# Patient Record
Sex: Female | Born: 2000
Health system: Southern US, Community
[De-identification: ages and names within clinical notes are randomized; demographics above are authoritative.]

## PROBLEM LIST (undated history)

## (undated) DIAGNOSIS — F419 Anxiety disorder, unspecified: Secondary | ICD-10-CM

## (undated) DIAGNOSIS — H539 Unspecified visual disturbance: Secondary | ICD-10-CM

## (undated) HISTORY — DX: Anxiety disorder, unspecified: F41.9

## (undated) HISTORY — DX: Unspecified visual disturbance: H53.9

---
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
## 2018-05-05 ENCOUNTER — Ambulatory Visit (INDEPENDENT_AMBULATORY_CARE_PROVIDER_SITE_OTHER): Payer: BLUE CROSS/BLUE SHIELD | Admitting: Neurology

## 2018-05-05 ENCOUNTER — Encounter: Payer: Self-pay | Admitting: Neurology

## 2018-05-05 VITALS — BP 120/72 | HR 75 | Ht 68.0 in | Wt 134.5 lb

## 2018-05-05 DIAGNOSIS — R410 Disorientation, unspecified: Secondary | ICD-10-CM | POA: Diagnosis not present

## 2018-05-05 NOTE — Progress Notes (Signed)
PATIENT: Patricia Booth DOB: [birth year]  Chief Complaint  Patient presents with  . Confusion Episodes    She is here with her mother, Randa EvensJoanne.  She is still doing well on lamotrigine xr 200mg  at bedtime.  She had one episodes on ,[place] in [place] on 04[place]18[place].  She feels this was related to sleep deprivation (had been up for 33 hours).     HISTORICAL  Patricia Booth 17 year old female, seen in refer by her primary care doctor Chales SalmonDees, Janet, for evaluation of visual disturbance, initial evaluation was on August 01 2017.  Patient recorded recurrent episode of sudden onset transient confusion, started in her eighth grade 3 years ago in 2015, after playing basketball, with a flashing light, she felt transient confused, difficulty focusing, lasting for few minutes,  She had multiple recurrent episode at her ninth grade in 2016, and often happened after she playing basketball, in the bright light, flashing colors, she would have transient blurry vision, difficulty focusing, her body going to fall down, lasting for few minutes, sometimes longer even few hours, but there was no clinical seizure activity noted.  She was seen by cardiologist in 2016 there was no significant abnormality found, because of her recurrent episode associated with event, she has to give up her basketball, avoiding scenario like this.  Most recent event was in September 2018, she was with her friend and family at a basketball event, the loud noise, bright light, the bright color triggered her similar sensation, she felt dizzy, as if she is going to passing out, she has to put her head between her knees, later she went down for dinner, even then, she feel off, spaced off, difficulty focusing, she can hear people talking to her, but seems to from distance, lower extremity weakness, her symptoms eventually went away after overnight sleep,  She denies morning jerk, full-term, developmentally normal, there was no history  of seizure, no family history of epilepsy.  Update November 04, 2017: She only had a couple spells of transient confusion in an overstimulating environment, lasting less than 5 minutes, no headache, lamotrigine ER 200 mg has been very helpful,  UPDATE May 05 2018: We personally reviewed MRI of the brain with without contrast in November 2018, small developmental venous anomaly in the left parietal lobe, no acute abnormality.  EEG was normal on August 21, 2017  She had one recurrent spells on April 18, 2017, this is after 33 hours sleep deprivation, [place], she had a sudden onset feeling dizzy, though mild, she put her head in between her legs, symptoms persistent, she did not have total loss of consciousness, was able to carry on a conversation with her mother, but felt generalized weakness, confusion, difficulty take steps, whole episode lasting 1 hour, no seizure-like spells, she was taken to the local emergency room, reported normal vital signs, she denies headaches, no nausea  She has been tolerating lamotrigine XR 200 mg every night well, no significant side effect noticed, it did help cut back the frequency and severity of the spells,  REVIEW OF SYSTEMS: Full 14 system review of systems performed and notable only for as above  ALLERGIES: No Known Allergies  HOME MEDICATIONS: Current Outpatient Medications  Medication Sig Dispense Refill  . ALPRAZolam (XANAX) 0.5 MG tablet Take 0.5 mg by mouth as needed for anxiety.     . LamoTRIgine 200 MG TB24 24 hour tablet Take 1 tablet (200 mg total) by mouth at bedtime. 90 tablet  4  . sertraline (ZOLOFT) 25 MG tablet Taking 1.5 tablets daily.     No current facility-administered medications for this visit.     PAST MEDICAL HISTORY: Past Medical History:  Diagnosis Date  . Anxiety   . Visual disturbance     PAST SURGICAL HISTORY: History reviewed. No pertinent surgical history.  FAMILY HISTORY: Family History  Problem  Relation Age of Onset  . Healthy Mother   . Healthy Father     SOCIAL HISTORY:  Social History   Socioeconomic History  . Marital status: Single    Spouse name: Not on file  . Number of children: 0  . Years of education: HS student  . Highest education level: Not on file  Occupational History  . Occupation: Consulting civil engineer  Social Needs  . Financial resource strain: Not on file  . Food insecurity:    Worry: Not on file    Inability: Not on file  . Transportation needs:    Medical: Not on file    Non-medical: Not on file  Tobacco Use  . Smoking status: Never Smoker  . Smokeless tobacco: Never Used  Substance and Sexual Activity  . Alcohol use: No  . Drug use: No  . Sexual activity: Not on file  Lifestyle  . Physical activity:    Days per week: Not on file    Minutes per session: Not on file  . Stress: Not on file  Relationships  . Social connections:    Talks on phone: Not on file    Gets together: Not on file    Attends religious service: Not on file    Active member of club or organization: Not on file    Attends meetings of clubs or organizations: Not on file    Relationship status: Not on file  . Intimate partner violence:    Fear of current or ex partner: Not on file    Emotionally abused: Not on file    Physically abused: Not on file    Forced sexual activity: Not on file  Other Topics Concern  . Not on file  Social History Narrative   Lives at home with parents.   Right-handed.   1 cup caffeine per day.     PHYSICAL EXAM   Vitals:   05/05/18 1549  BP: 120/72  Pulse: 75  Weight: 134 lb 8 oz (61 kg)  Height: 5\' 8"  (1.727 m)    Not recorded      Body mass index is 20.45 kg/m.  PHYSICAL EXAMNIATION:  Gen: NAD, conversant, well nourised, obese, well groomed                     Cardiovascular: Regular rate rhythm, no peripheral edema, warm, nontender. Eyes: Conjunctivae clear without exudates or hemorrhage Neck: Supple, no carotid  bruits. Pulmonary: Clear to auscultation bilaterally   NEUROLOGICAL EXAM:  MENTAL STATUS: Speech:    Speech is normal; fluent and spontaneous with normal comprehension.  Cognition:     Orientation to time, place and person     Normal recent and remote memory     Normal Attention span and concentration     Normal Language, naming, repeating,spontaneous speech     Fund of knowledge   CRANIAL NERVES: CN II: Visual fields are full to confrontation. Fundoscopic exam is normal with sharp discs and no vascular changes. Pupils are round equal and briskly reactive to light. CN III, IV, VI: extraocular movement are normal. No ptosis. CN V:  Facial sensation is intact to pinprick in all 3 divisions bilaterally. Corneal responses are intact.  CN VII: Face is symmetric with normal eye closure and smile. CN VIII: Hearing is normal to rubbing fingers CN IX, X: Palate elevates symmetrically. Phonation is normal. CN XI: Head turning and shoulder shrug are intact CN XII: Tongue is midline with normal movements and no atrophy.  MOTOR: There is no pronator drift of out-stretched arms. Muscle bulk and tone are normal. Muscle strength is normal.  REFLEXES: Reflexes are 2+ and symmetric at the biceps, triceps, knees, and ankles. Plantar responses are flexor.  SENSORY: Intact to light touch, pinprick, positional sensation and vibratory sensation are intact in fingers and toes.  COORDINATION: Rapid alternating movements and fine finger movements are intact. There is no dysmetria on finger-to-nose and heel-knee-shin.    GAIT/STANCE: Posture is normal. Gait is steady with normal steps, base, arm swing, and turning. Heel and toe walking are normal. Tandem gait is normal.  Romberg is absent.   DIAGNOSTIC DATA (LABS, IMAGING, TESTING) - I reviewed patient records, labs, notes, testing and imaging myself where available.   ASSESSMENT AND PLAN  ADREONNA YONTZ is a 17 y.o. female    Recurrent spells  of confusion,  Triggers are sleep deprivation, bright light, color, moving objects, possible complex partial seizure, versus seizure  MRI of the brain with and without contrast showed left parietal small venous anomaly  EEG was normal  Most recent one was on April 18 2018  Continue lamotrigine xr 200 mg every night, which has truly helped her spells  Levert Feinstein, M.D. Ph.D.  [facility] [address], [address] [place], [place] [address] Ph: [phone] Fax: [phone]  CC: [name], MD

## 2018-09-23 ENCOUNTER — Encounter: Payer: Self-pay | Admitting: Neurology

## 2018-09-23 ENCOUNTER — Ambulatory Visit (INDEPENDENT_AMBULATORY_CARE_PROVIDER_SITE_OTHER): Payer: BLUE CROSS/BLUE SHIELD | Admitting: Neurology

## 2018-09-23 VITALS — BP 117/76 | HR 73 | Ht 68.0 in | Wt 134.2 lb

## 2018-09-23 DIAGNOSIS — R569 Unspecified convulsions: Secondary | ICD-10-CM

## 2018-09-23 MED ORDER — LAMOTRIGINE ER 200 MG PO TB24: 200.0000 mg | ORAL_TABLET | Freq: Every day | ORAL | 4 refills | Status: DC

## 2018-09-23 NOTE — Progress Notes (Signed)
PATIENT: Patricia Booth DOB: August 28, 2001  Chief Complaint  Patient presents with  . Recurrent Confusion Spells    She is here with a friend today.  She is doing well on Lamictal.  Denies any seizure-like activity since last seen.     HISTORICAL  Patricia Booth 17 year old female, seen in refer by her primary care doctor [name], for evaluation of visual disturbance, initial evaluation was on August 01 2017.  Patient recorded recurrent episode of sudden onset transient confusion, started in her eighth grade 3 years ago in 2015, after playing basketball, with a flashing light, she felt transient confused, difficulty focusing, lasting for few minutes,  She had multiple recurrent episode at her ninth grade in 2016, and often happened after she playing basketball, in the bright light, flashing colors, she would have transient blurry vision, difficulty focusing, her body going to fall down, lasting for few minutes, sometimes longer even few hours, but there was no clinical seizure activity noted.  She was seen by cardiologist in 2016 there was no significant abnormality found, because of her recurrent episode associated with event, she has to give up her basketball, avoiding scenario like this.  Most recent event was in September 2018, she was with her friend and family at a basketball event, the loud noise, bright light, the bright color triggered her similar sensation, she felt dizzy, as if she is going to passing out, she has to put her head between her knees, later she went down for dinner, even then, she feel off, spaced off, difficulty focusing, she can hear people talking to her, but seems to from distance, lower extremity weakness, her symptoms eventually went away after overnight sleep,  She denies morning jerk, full-term, developmentally normal, there was no history of seizure, no family history of epilepsy.  Update November 04, 2017: She only had a couple spells of transient  confusion in an overstimulating environment, lasting less than 5 minutes, no headache, lamotrigine ER 200 mg has been very helpful,  UPDATE May 05 2018: We personally reviewed MRI of the brain with without contrast in November 2018, small developmental venous anomaly in the left parietal lobe, no acute abnormality.  EEG was normal on August 21, 2017  She had one recurrent spells on April 18, 2017, this is after 33 hours sleep deprivation, tourist at [place], she had a sudden onset feeling dizzy, though mild, she put her head in between her legs, symptoms persistent, she did not have total loss of consciousness, was able to carry on a conversation with her mother, but felt generalized weakness, confusion, difficulty take steps, whole episode lasting 1 hour, no seizure-like spells, she was taken to the local emergency room, reported normal vital signs, she denies headaches, no nausea  She has been tolerating lamotrigine XR 200 mg every night well, no significant side effect noticed, it did help cut back the frequency and severity of the spells,  UPDATE Dec 17th 2019: She has no significant side effect to lamotrigine XR 200 mg, she no longer has recurrent spells, it definitely has helped her,   REVIEW OF SYSTEMS: Full 14 system review of systems performed and notable only for as above  ALLERGIES: No Known Allergies  HOME MEDICATIONS: Current Outpatient Medications  Medication Sig Dispense Refill  . ALPRAZolam (XANAX) 0.5 MG tablet Take 0.5 mg by mouth as needed for anxiety.     . LamoTRIgine 200 MG TB24 24 hour tablet Take 1 tablet (200 mg total) by mouth  at bedtime. 90 tablet 4  . sertraline (ZOLOFT) 25 MG tablet Taking 1.5 tablets daily.     No current facility-administered medications for this visit.     PAST MEDICAL HISTORY: Past Medical History:  Diagnosis Date  . Anxiety   . Visual disturbance     PAST SURGICAL HISTORY: History reviewed. No pertinent surgical  history.  FAMILY HISTORY: Family History  Problem Relation Age of Onset  . Healthy Mother   . Healthy Father     SOCIAL HISTORY:  Social History   Socioeconomic History  . Marital status: Single    Spouse name: Not on file  . Number of children: 0  . Years of education: HS student  . Highest education level: Not on file  Occupational History  . Occupation: Consulting civil engineertudent  Social Needs  . Financial resource strain: Not on file  . Food insecurity:    Worry: Not on file    Inability: Not on file  . Transportation needs:    Medical: Not on file    Non-medical: Not on file  Tobacco Use  . Smoking status: Never Smoker  . Smokeless tobacco: Never Used  Substance and Sexual Activity  . Alcohol use: No  . Drug use: No  . Sexual activity: Not on file  Lifestyle  . Physical activity:    Days per week: Not on file    Minutes per session: Not on file  . Stress: Not on file  Relationships  . Social connections:    Talks on phone: Not on file    Gets together: Not on file    Attends religious service: Not on file    Active member of club or organization: Not on file    Attends meetings of clubs or organizations: Not on file    Relationship status: Not on file  . Intimate partner violence:    Fear of current or ex partner: Not on file    Emotionally abused: Not on file    Physically abused: Not on file    Forced sexual activity: Not on file  Other Topics Concern  . Not on file  Social History Narrative   Lives at home with parents.   Right-handed.   1 cup caffeine per day.     PHYSICAL EXAM   Vitals:   09/23/18 1304  BP: 117/76  Pulse: 73  Weight: 134 lb 4 oz (60.9 kg)  Height: 5\' 8"  (1.727 m)    Not recorded      Body mass index is 20.41 kg/m.  PHYSICAL EXAMNIATION:  Gen: NAD, conversant, well nourised, obese, well groomed                     Cardiovascular: Regular rate rhythm, no peripheral edema, warm, nontender. Eyes: Conjunctivae clear without  exudates or hemorrhage Neck: Supple, no carotid bruits. Pulmonary: Clear to auscultation bilaterally   NEUROLOGICAL EXAM:  MENTAL STATUS: Speech:    Speech is normal; fluent and spontaneous with normal comprehension.  Cognition:     Orientation to time, place and person     Normal recent and remote memory     Normal Attention span and concentration     Normal Language, naming, repeating,spontaneous speech     Fund of knowledge   CRANIAL NERVES: CN II: Visual fields are full to confrontation. Fundoscopic exam is normal with sharp discs and no vascular changes. Pupils are round equal and briskly reactive to light. CN III, IV, VI: extraocular movement are normal.  No ptosis. CN V: Facial sensation is intact to pinprick in all 3 divisions bilaterally. Corneal responses are intact.  CN VII: Face is symmetric with normal eye closure and smile. CN VIII: Hearing is normal to rubbing fingers CN IX, X: Palate elevates symmetrically. Phonation is normal. CN XI: Head turning and shoulder shrug are intact CN XII: Tongue is midline with normal movements and no atrophy.  MOTOR: There is no pronator drift of out-stretched arms. Muscle bulk and tone are normal. Muscle strength is normal.  REFLEXES: Reflexes are 2+ and symmetric at the biceps, triceps, knees, and ankles. Plantar responses are flexor.  SENSORY: Intact to light touch, pinprick, positional sensation and vibratory sensation are intact in fingers and toes.  COORDINATION: Rapid alternating movements and fine finger movements are intact. There is no dysmetria on finger-to-nose and heel-knee-shin.    GAIT/STANCE: Posture is normal. Gait is steady with normal steps, base, arm swing, and turning. Heel and toe walking are normal. Tandem gait is normal.  Romberg is absent.   DIAGNOSTIC DATA (LABS, IMAGING, TESTING) - I reviewed patient records, labs, notes, testing and imaging myself where available.   ASSESSMENT AND PLAN  KAHLEN BOYDE is a 17 y.o. female    Recurrent spells of confusion,  Triggers are sleep deprivation, bright light, color, moving objects, probable complex partial seizure  MRI of the brain with and without contrast showed left parietal small venous anomaly  EEG was normal  Most recent one was on April 18 2018  Continue lamotrigine xr 200 mg every night, which has truly helped her spells  Return to clinic in 1 year, we will continue to refill primary care physician  Levert Feinstein, M.D. Ph.D.  [facility] [address], [address] [place], [place] [address] Ph: [phone] Fax: [phone]  CC: [name], MD

## 2018-11-05 ENCOUNTER — Other Ambulatory Visit: Payer: Self-pay | Admitting: Neurology

## 2018-11-25 ENCOUNTER — Telehealth: Payer: Self-pay | Admitting: Neurology

## 2018-11-25 NOTE — Telephone Encounter (Signed)
Pt's mother/Joanne DPR said the patient will miss 3 night's dose of LamoTRIgine 200 MG TB24 24 hour tablet, she is very concerned.. The patient will coming home on Friday and was not aware she would be out of the medication. Mom has decided to overnight the medication.  FYI

## 2018-11-25 NOTE — Telephone Encounter (Addendum)
Late documentation - first message earlier this morning.  Left message for patient's mother to return my call.  I let her know that if the patient is out of town and has forgotten her medication, we can send in a temporary supply to a pharmacy close to her.  This will allow the patient to get the medication today and spare them the expense of an overnight shipment.

## 2018-11-25 NOTE — Telephone Encounter (Signed)
I called the patient's mother a third time.  Left detailed message that we are happy to send an emergency supply of lamotrigine to the nearest pharmacy, if her daughter is out of town and left her medication.  Also, stated the importance of her getting back on the medication as quickly as possible.  Provided our number to call back.

## 2018-11-25 NOTE — Telephone Encounter (Addendum)
Left message, with patient's mother on DPR Hortencia Peeler - #[phone]), requesting a return call.  We have alternate options (send temporary supply to the pharmacy close to her daughter).

## 2018-11-27 MED ORDER — LAMOTRIGINE ER 200 MG PO TB24: 200.0000 mg | ORAL_TABLET | Freq: Every day | ORAL | 0 refills | Status: DC

## 2018-11-27 NOTE — Telephone Encounter (Signed)
Patient's mother called back today.  States she did not return my call the other day because she had already sent the medication overnight.  However, she spoke to her daughter today and she never received the package.  She is suppose to come home tomorrow but they are concerned her trip may be extended due to inclement weather.  She would like a lamotrigine prescription sent to [facility] on [address] in [place], [place].    The prescription has been sent electronically.

## 2018-11-27 NOTE — Addendum Note (Signed)
Addended by: Lindell Spar C on: 11/27/2018 10:40 AM   Modules accepted: Orders

## 2018-12-20 ENCOUNTER — Other Ambulatory Visit: Payer: Self-pay | Admitting: Neurology

## 2019-01-17 ENCOUNTER — Other Ambulatory Visit: Payer: Self-pay | Admitting: Neurology

## 2019-05-25 ENCOUNTER — Other Ambulatory Visit: Payer: Self-pay | Admitting: Neurology

## 2019-09-23 ENCOUNTER — Telehealth: Payer: Self-pay | Admitting: Neurology

## 2019-09-23 NOTE — Telephone Encounter (Signed)
The patient stated for the last two months she has been taking samples of lo loestrin.  When she went to get a written prescription filled, the pharmacist told her there would be an interaction w/ the Lamictal.  She has now stopped this birth control.  States her OB-GYN is going to be prescribing a progesterone only birth control but she is unsure of the name.  It has not been sent in yet.  She will call w/ the name prior to taking it.  She also scheduled her yearly follow up on 10/05/2019.  She is doing a Pharmacist, community visit w/ Sarah.  States she is doing well.  She verbalized understanding that when being prescribed a new medication it is very important for the physicians to know what other medications are being taken.

## 2019-09-23 NOTE — Telephone Encounter (Signed)
Patient called wanting to know if she could speak to someone in regards to her medications. Patient states a couple of things have changed and she wanted to touch base.   Please follow up.

## 2019-10-05 ENCOUNTER — Encounter: Payer: Self-pay | Admitting: Neurology

## 2019-10-05 ENCOUNTER — Telehealth (INDEPENDENT_AMBULATORY_CARE_PROVIDER_SITE_OTHER): Payer: Managed Care, Other (non HMO) | Admitting: Neurology

## 2019-10-05 DIAGNOSIS — R569 Unspecified convulsions: Secondary | ICD-10-CM | POA: Diagnosis not present

## 2019-10-05 NOTE — Progress Notes (Signed)
Virtual Visit via Video Note  I connected with Patricia Booth on 10/05/19 at  1:45 PM EST by a video enabled telemedicine application and verified that I am speaking with the correct person using two identifiers.  Location: Patient: at her home Provider: in the office    I discussed the limitations of evaluation and management by telemedicine and the availability of in person appointments. The patient expressed understanding and agreed to proceed.  History of Present Illness: Patricia Booth 18 year old female, seen in refer by her primary care doctor Harrie Jeans, for evaluation of visual disturbance, initial evaluation was on August 01 2017.  Patient recorded recurrent episode of sudden onset transient confusion, started in her eighth grade 3 years ago in 2015, after playing basketball, with a flashing light, she felt transient confused, difficulty focusing, lasting for few minutes,  She had multiple recurrent episode at her ninth grade in 2016, and often happened after she playing basketball, in the bright light, flashing colors, she would have transient blurry vision, difficulty focusing, her body going to fall down, lasting for few minutes, sometimes longer even few hours, but there was no clinical seizure activity noted.  She was seen by cardiologist in 2016 there was no significant abnormality found, because of her recurrent episode associated with event, she has to give up her basketball, avoiding scenario like this.  Most recent event was in September 2018, she was with her friend and family at a basketball event, the loud noise, bright light, the bright color triggered her similar sensation, she felt dizzy, as if she is going to passing out, she has to put her head between her knees, later she went down for dinner, even then, she feel off, spaced off, difficulty focusing, she can hear people talking to her, but seems to from distance, lower extremity weakness, her symptoms  eventually went away after overnight sleep,  She denies morning jerk, full-term, developmentally normal, there was no history of seizure, no family history of epilepsy.  Update November 04, 2017: She only had a couple spells of transient confusion in an overstimulating environment, lasting less than 5 minutes, no headache, lamotrigine ER 200 mg has been very helpful,  UPDATE May 05 2018: We personally reviewed MRI of the brain with without contrast in November 2018, small developmental venous anomaly in the left parietal lobe, no acute abnormality.  EEG was normal on August 21, 2017  She had one recurrent spells on April 18, 2017, this is after 33 hours sleep deprivation, tourist at [place], she had a sudden onset feeling dizzy, though mild, she put her head in between her legs, symptoms persistent, she did not have total loss of consciousness, was able to carry on a conversation with her mother, but felt generalized weakness, confusion, difficulty take steps, whole episode lasting 1 hour, no seizure-like spells, she was taken to the local emergency room, reported normal vital signs, she denies headaches, no nausea  She has been tolerating lamotrigine XR 200 mg every night well, no significant side effect noticed, it did help cut back the frequency and severity of the spells,  UPDATE Dec 17th 2019: She has no significant side effect to lamotrigine XR 200 mg, she no longer has recurrent spells, it definitely has helped her,  Update October 05, 2019 SS: She presents today for follow-up via virtual visit.  She remains on Lamictal XR 200 mg at bedtime, reports compliance with medication.  She reports she started birth control Lo Loestrin samples  for a few months, when she went to the pharmacy, they reported potential interaction with Lamictal and birth control.  She reports while taking this medication, she had sensation that a seizure was coming on, feeling somewhat off, but no seizure  occurred.  She has since been switched to progesterone only birth control, for the last week.  She is a Medical laboratory scientific officer at [employer].  She says overall she has been very well on current dose of Lamictal, and it has been very helpful.   Observations/Objective: Virtual visit, is alert and oriented, answers questions appropriately, speech is clear and concise, follows commands, facial symmetry noted, no difficulty with gait  Assessment and Plan: 1.  Recurrent spells of confusion -Triggers have been sleep deprivation, bright lights, color, moving objects, probable complex partial seizure -MRI of the brain with and without contrast showed left parietal small venous anomaly -EEG was normal -Has not had recent seizure episode, last was in July 2019 -She did a trial of Lo Loestrin birth control for the last several months, noted interaction between Lamictal and birth control, feeling out of it, like seizure might occur-estrogen birth control has since been discontinued, she has been on progesterone only birth control for the last week -I will check a Lamictal level, I am hopeful things will start to level out, the further away from the estrogen birth control, if Lamictal level is therapeutic, may stay same dose or consider increase of 300 mg XR at bedtime if she prefers, if still having pre-seizure feeling, but historically has done quite well on current dose 200 XR mg -She will follow-up in 6 months or sooner if needed, call for recurrent seizure  Follow Up Instructions: 6 months 04/07/2020 11:15   I discussed the assessment and treatment plan with the patient. The patient was provided an opportunity to ask questions and all were answered. The patient agreed with the plan and demonstrated an understanding of the instructions.   The patient was advised to call back or seek an in-person evaluation if the symptoms worsen or if the condition fails to improve as anticipated.  I provided 15 minutes of non-face-to-face  time during this encounter.  Otila Kluver, DNP  [facility] [address] [place], [place] [address] [phone]

## 2019-10-07 ENCOUNTER — Other Ambulatory Visit (INDEPENDENT_AMBULATORY_CARE_PROVIDER_SITE_OTHER): Payer: Self-pay

## 2019-10-07 ENCOUNTER — Other Ambulatory Visit: Payer: Self-pay

## 2019-10-07 DIAGNOSIS — Z0289 Encounter for other administrative examinations: Secondary | ICD-10-CM

## 2019-10-07 DIAGNOSIS — R569 Unspecified convulsions: Secondary | ICD-10-CM

## 2019-10-08 LAB — LAMOTRIGINE LEVEL: Lamotrigine Lvl: 2.9 ug/mL (ref 2.0–20.0)

## 2019-10-12 IMAGING — US US THYROID
1 series · 14 of 25 positions shown · non-contrast
Comparison: None.

CLINICAL DATA: Palpable abnormality. Thyroid enlargement based on
physical exam.

EXAM:
THYROID ULTRASOUND
TECHNIQUE: Ultrasound examination of the thyroid gland and adjacent soft
tissues was performed.

[Series 1: us thyroid · 0.06mm/px · 14 of 32 slices shown]
[im 1/32]
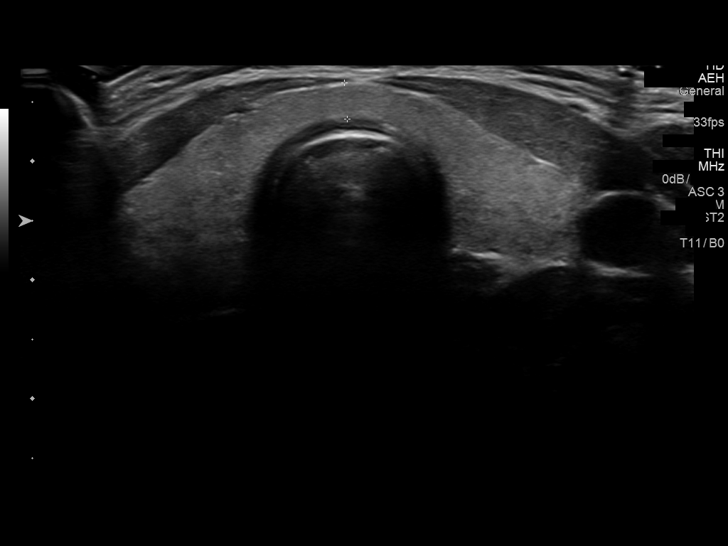
[im 3/32]
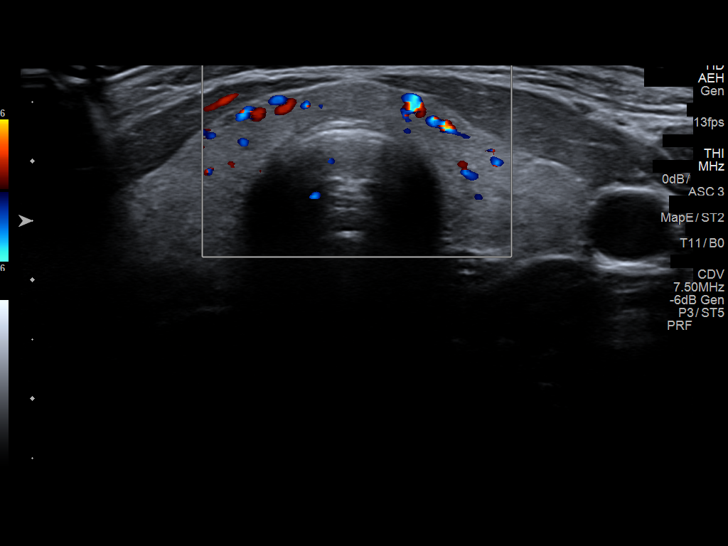
[im 6/32]
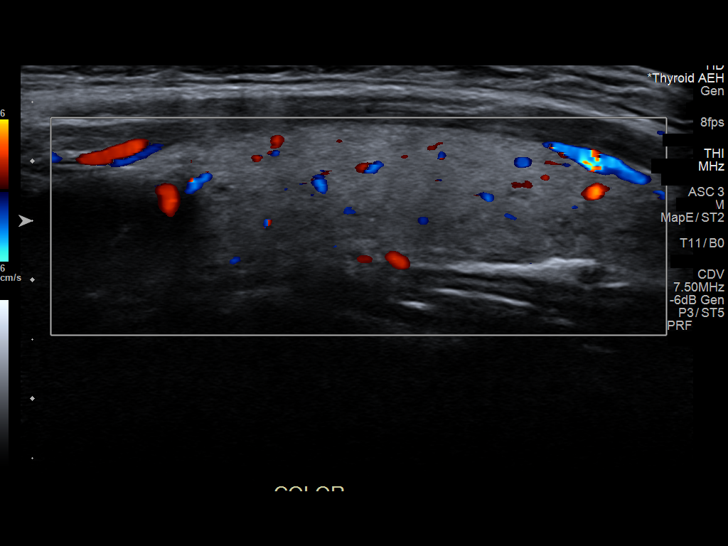
[im 8/32]
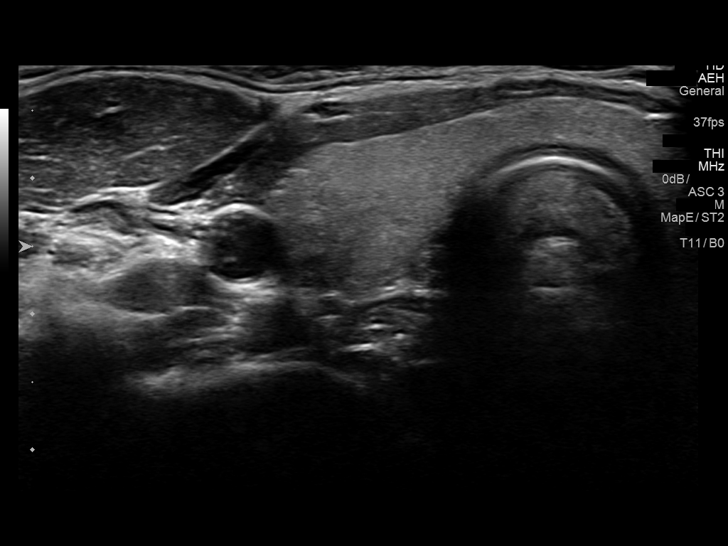
[im 11/32]
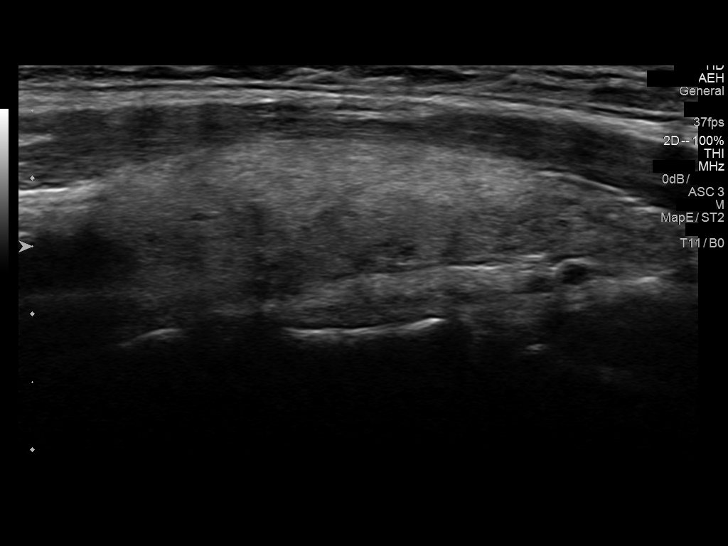
[im 12/32]
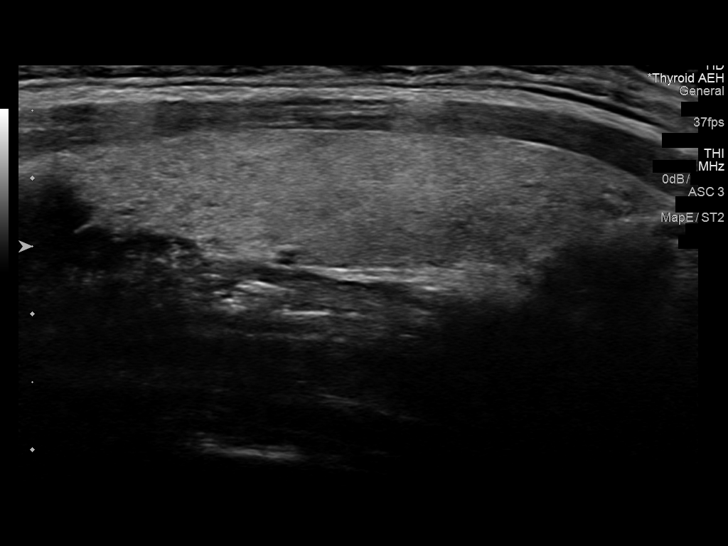
[im 15/32]
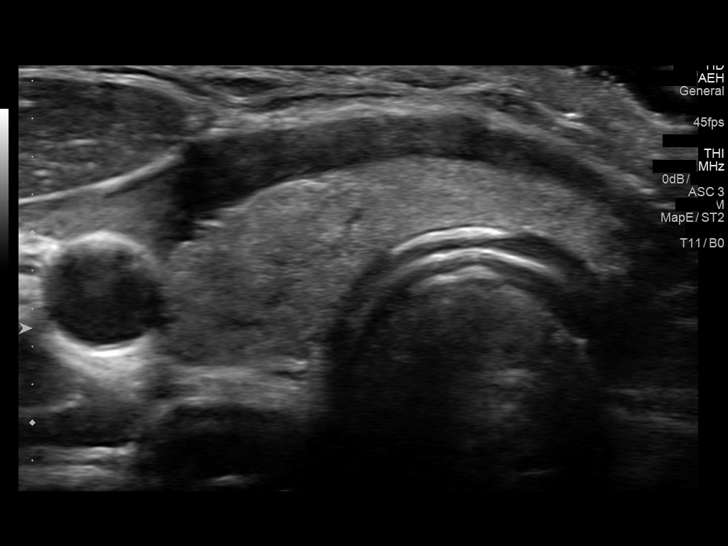
[im 17/32]
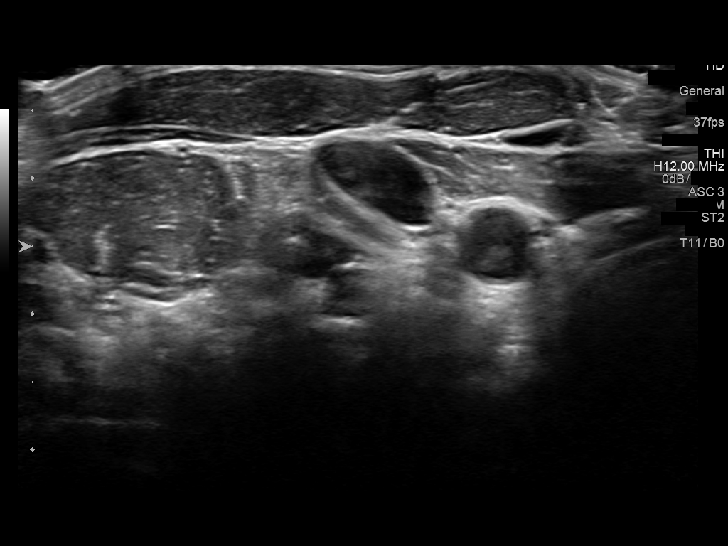
[im 20/32]
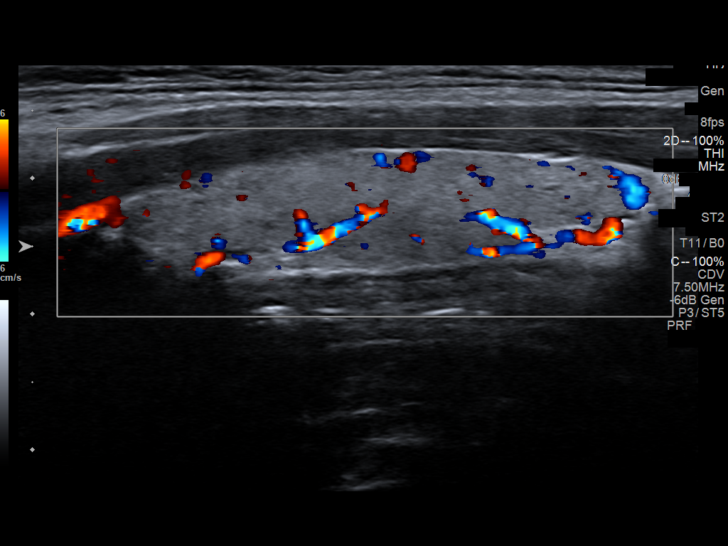
[im 21/32]
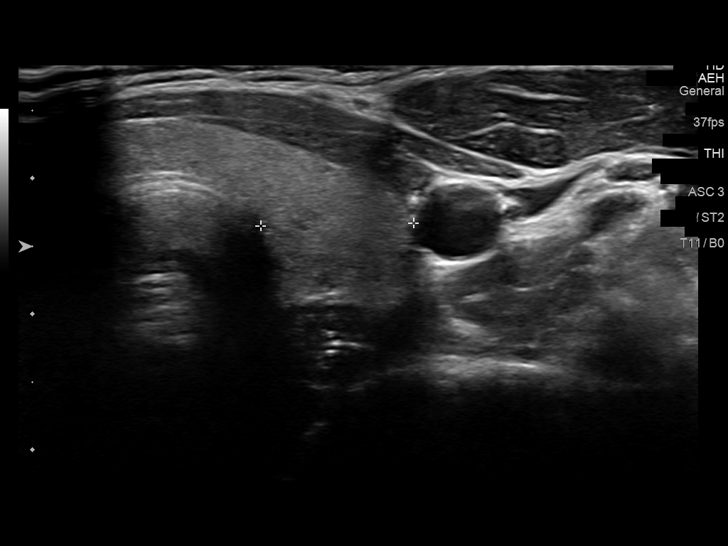
[im 24/32]
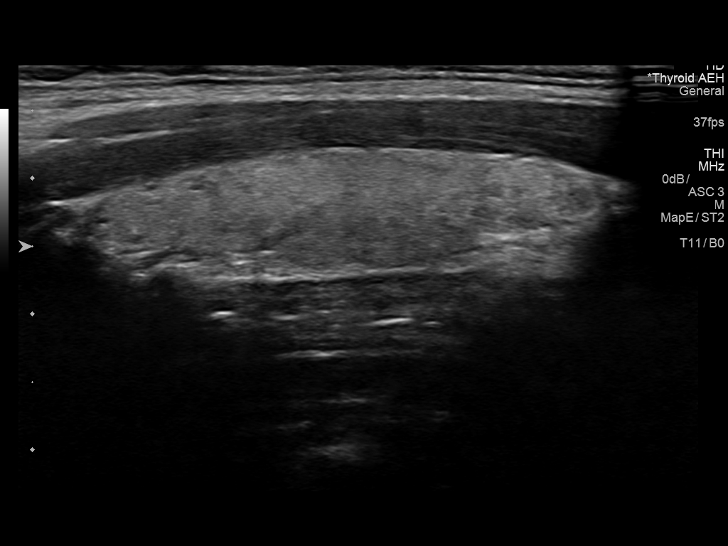
[im 26/32]
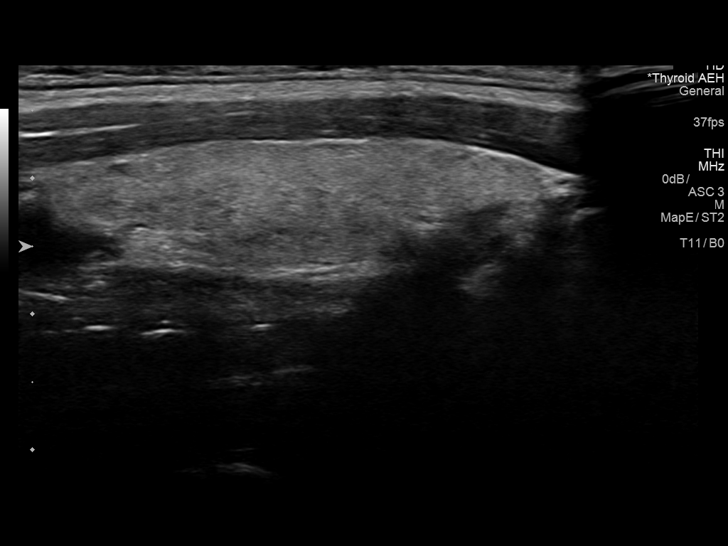
[im 29/32]
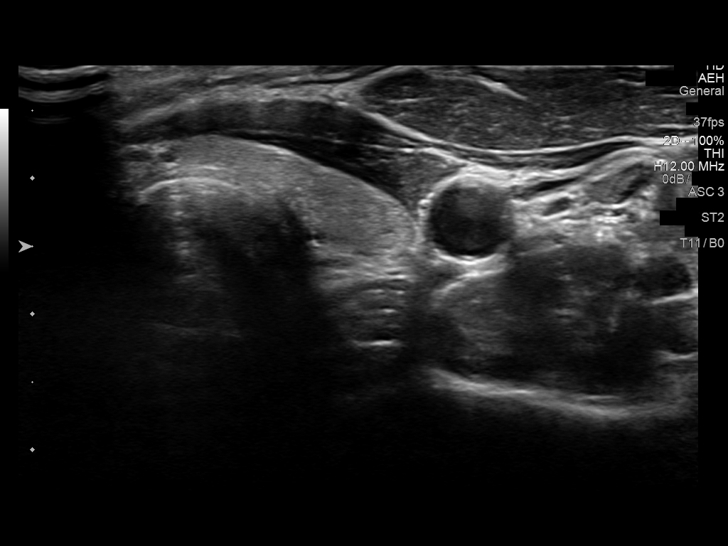
[im 32/32]
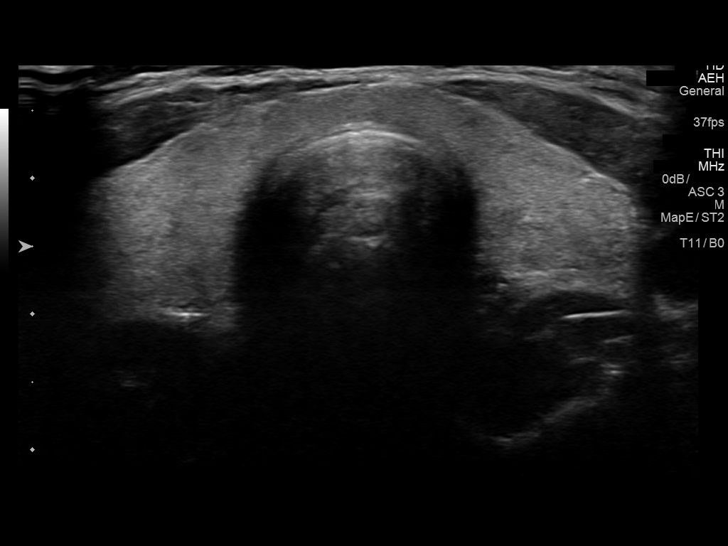

[14 of 25 positions shown; findings below may reference images not displayed]

FINDINGS: Parenchymal Echotexture: Normal

Isthmus: 0.3 cm

Right lobe: 4.3 x 1.1 x 1.4 cm

Left lobe: 4.0 x 1.0 x 1.1 cm

_________________________________________________________

Estimated total number of nodules >/= 1 cm: 0

Number of spongiform nodules >/=  2 cm not described below (TR1): 0

Number of mixed cystic and solid nodules >/= 1.5 cm not described
below (TR2): 0

_________________________________________________________

No discrete nodules are seen within the thyroid gland.
IMPRESSION: Within normal limits. No evidence of nodule. No evidence of abnormal
adenopathy or abnormal mass in the neck.

The above is in keeping with the ACR TI-RADS recommendations - [HOSPITAL] 6626;[DATE].

## 2019-10-15 ENCOUNTER — Telehealth: Payer: Self-pay | Admitting: *Deleted

## 2019-10-15 NOTE — Telephone Encounter (Signed)
I spoke to pt and relayed the message to her about her lamictal level.  She is on now for 3 wk progestrone only and is feeling better.  She will stay on the lamictal 200mg  XR for now and does not need refill.

## 2019-10-15 NOTE — Telephone Encounter (Signed)
LMVM for pt to call back when she is able.

## 2019-10-15 NOTE — Telephone Encounter (Signed)
-----   Message from Glean Salvo, NP sent at 10/11/2019  9:14 PM EST ----- Please call the patient. Please let her know lamictal level is within therapeutic range. See how she is doing since she has been off estrogen birth control. According to up to date, lamictal levels can decrease in the presence of estrogen birth control. She is taking Lamictal XR 200 mg, and has done very well. We could either stay at same dose if doing well/better, or try increase to 300 mg if she still feels pre-seizure feeling. There is no other lamictal level I can find for comparison. I will need to refill the medication if we stay at same dose.

## 2019-10-15 NOTE — Telephone Encounter (Signed)
-----   Message from Sarah J Slack, NP sent at 10/11/2019  9:14 PM EST ----- Please call the patient. Please let her know lamictal level is within therapeutic range. See how she is doing since she has been off estrogen birth control. According to up to date, lamictal levels can decrease in the presence of estrogen birth control. She is taking Lamictal XR 200 mg, and has done very well. We could either stay at same dose if doing well/better, or try increase to 300 mg if she still feels pre-seizure feeling. There is no other lamictal level I can find for comparison. I will need to refill the medication if we stay at same dose.  

## 2019-11-04 ENCOUNTER — Other Ambulatory Visit: Payer: Self-pay | Admitting: Neurology

## 2019-11-17 NOTE — Progress Notes (Signed)
I have reviewed and agreed above plan. 

## 2019-12-25 ENCOUNTER — Other Ambulatory Visit: Payer: Self-pay | Admitting: Family Medicine

## 2019-12-25 DIAGNOSIS — H109 Unspecified conjunctivitis: Secondary | ICD-10-CM

## 2019-12-25 MED ORDER — MOXIFLOXACIN HCL 0.5 % OP SOLN - NO CHARGE: 1.0000 [drp] | Freq: Three times a day (TID) | OPHTHALMIC | 1 refills | Status: DC

## 2020-04-07 ENCOUNTER — Ambulatory Visit: Payer: Managed Care, Other (non HMO) | Admitting: Neurology

## 2020-05-09 ENCOUNTER — Telehealth: Payer: Self-pay | Admitting: Neurology

## 2020-05-09 NOTE — Telephone Encounter (Signed)
Pt's mother called wanting to know if the pt can be seen sooner than her scheduled appt due to her having to return to school on the 13th of this month. Please advise.

## 2020-05-09 NOTE — Telephone Encounter (Signed)
Spoke to pt and mother of pt.  Ok to do VV this week with MM/NP since will be going back to school 05-20-20.  Doing ok we see for seizures.

## 2020-05-11 ENCOUNTER — Telehealth (INDEPENDENT_AMBULATORY_CARE_PROVIDER_SITE_OTHER): Payer: Managed Care, Other (non HMO) | Admitting: Adult Health

## 2020-05-11 DIAGNOSIS — R569 Unspecified convulsions: Secondary | ICD-10-CM | POA: Diagnosis not present

## 2020-05-11 NOTE — Progress Notes (Signed)
PATIENT: Patricia Booth DOB: 2001/02/10  REASON FOR VISIT: follow up HISTORY FROM: patient  Virtual Visit via Video Note  I connected with Patricia Booth on 05/11/20 at  8:30 AM EDT by a video enabled telemedicine application located remotely at [facility] and verified that I am speaking with the correct person using two identifiers who was located at their own home.   I discussed the limitations of evaluation and management by telemedicine and the availability of in person appointments. The patient expressed understanding and agreed to proceed.   PATIENT: Patricia Booth DOB: Jun 09, 2001  REASON FOR VISIT: follow up HISTORY FROM: patient  HISTORY OF PRESENT ILLNESS: Today 05/11/20:  Ms. Liston is a 19 year old female with a history of seizures.  She returns today for follow-up.  She is currently on lamotrigine ER 200 mg daily.  She states that she is not had any seizure events.  She states that if she has a lack of sleep or is around flashing lights she may get a weird sensation as if she is going to have a seizure but then it never comes.  She tries to avoid these triggers.  She is no longer on birth control.  She states that the progesterone only birth control made her feel weird.  She states that she feels better off of birth control.  She returns today for an evaluation.  HISTORY October 05, 2019 SS: She presents today for follow-up via virtual visit.  She remains on Lamictal XR 200 mg at bedtime, reports compliance with medication.  She reports she started birth control Lo Loestrin samples for a few months, when she went to the pharmacy, they reported potential interaction with Lamictal and birth control.  She reports while taking this medication, she had sensation that a seizure was coming on, feeling somewhat off, but no seizure occurred.  She has since been switched to progesterone only birth control, for the last week.  She is a Medical laboratory scientific officer at [employer].  She says  overall she has been very well on current dose of Lamictal, and it has been very helpful.  REVIEW OF SYSTEMS: Out of a complete 14 system review of symptoms, the patient complains only of the following symptoms, and all other reviewed systems are negative.  See HPI  ALLERGIES: No Known Allergies  HOME MEDICATIONS: Outpatient Medications Prior to Visit  Medication Sig Dispense Refill  . ALPRAZolam (XANAX) 0.5 MG tablet Take 0.5 mg by mouth as needed for anxiety.     . LamoTRIgine 200 MG TB24 24 hour tablet TAKE 1 TABLET BY MOUTH EVERYDAY AT BEDTIME 90 tablet 3  . moxifloxacin (VIGAMOX) 0.5 % SOLN Place 1 drop into both eyes 3 (three) times daily. 1 Bottle 1  . sertraline (ZOLOFT) 25 MG tablet Taking 1.5 tablets daily.     No facility-administered medications prior to visit.    PAST MEDICAL HISTORY: Past Medical History:  Diagnosis Date  . Anxiety   . Visual disturbance     PAST SURGICAL HISTORY: No past surgical history on file.  FAMILY HISTORY: Family History  Problem Relation Age of Onset  . Healthy Mother   . Healthy Father     SOCIAL HISTORY: Social History   Socioeconomic History  . Marital status: Single    Spouse name: Not on file  . Number of children: 0  . Years of education: HS student  . Highest education level: Not on file  Occupational History  . Occupation: Consulting civil engineer  Tobacco Use  . Smoking status: Never Smoker  . Smokeless tobacco: Never Used  Vaping Use  . Vaping Use: Never used  Substance and Sexual Activity  . Alcohol use: No  . Drug use: No  . Sexual activity: Not on file  Other Topics Concern  . Not on file  Social History Narrative   Lives at home with parents.   Right-handed.   1 cup caffeine per day.   Social Determinants of Health   Financial Resource Strain:   . Difficulty of Paying Living Expenses:   Food Insecurity:   . Worried About Programme researcher, broadcasting/film/video in the Last Year:   . Barista in the Last Year:     Transportation Needs:   . Freight forwarder (Medical):   Marland Kitchen Lack of Transportation (Non-Medical):   Physical Activity:   . Days of Exercise per Week:   . Minutes of Exercise per Session:   Stress:   . Feeling of Stress :   Social Connections:   . Frequency of Communication with Friends and Family:   . Frequency of Social Gatherings with Friends and Family:   . Attends Religious Services:   . Active Member of Clubs or Organizations:   . Attends Banker Meetings:   Marland Kitchen Marital Status:   Intimate Partner Violence:   . Fear of Current or Ex-Partner:   . Emotionally Abused:   Marland Kitchen Physically Abused:   . Sexually Abused:       PHYSICAL EXAM Generalized: Well developed, in no acute distress   Neurological examination  Mentation: Alert oriented to time, place, history taking. Follows all commands speech and language fluent Cranial nerve II-XII:Extraocular movements were full. Facial symmetry noted. uvula tongue midline. Head turning and shoulder shrug  were normal and symmetric. Motor: Good strength throughout subjectively per patient Sensory: Sensory testing is intact to soft touch on all 4 extremities subjectively per patient Coordination: Cerebellar testing reveals good finger-nose-finger  Gait and station: Patient is able to stand from a seated position. gait is normal.  Reflexes: UTA  DIAGNOSTIC DATA (LABS, IMAGING, TESTING) - I reviewed patient records, labs, notes, testing and imaging myself where available.  Lab Results  Component Value Date   WBC 4.2 08/01/2017   HGB 13.8 08/01/2017   HCT 40.8 08/01/2017   MCV 91 08/01/2017   PLT 276 08/01/2017      Component Value Date/Time   NA 137 08/01/2017 1053   K 4.8 08/01/2017 1053   CL 100 08/01/2017 1053   CO2 24 08/01/2017 1053   GLUCOSE 89 08/01/2017 1053   BUN 10 08/01/2017 1053   CREATININE 0.73 08/01/2017 1053   CALCIUM 9.8 08/01/2017 1053   PROT 7.3 08/01/2017 1053   ALBUMIN 4.7 08/01/2017 1053    AST 19 08/01/2017 1053   ALT 11 08/01/2017 1053   ALKPHOS 55 08/01/2017 1053   BILITOT 0.4 08/01/2017 1053   GFRNONAA 121 08/01/2017 1053   GFRAA 139 08/01/2017 1053    Lab Results  Component Value Date   TSH 1.520 08/01/2017      ASSESSMENT AND PLAN 19 y.o. year old female  has a past medical history of Anxiety and Visual disturbance. here with :  Seizures   Continue Lamictal XR 200 mg at bedtime  We will check blood work at the next visit  Advised patient that she should try to avoid triggers such as sleep deprivation and flashing lights  Advised if she has any seizure event  she should let us know  Follow-up in 1 year or sooner if needed   I spent 20 minutes of face-to-face and non-face-to-face time with patient.  This included previsit chart review, lab review, study review, order entry, electronic health record documentation, patient education.    Butch Penny, MSN, NP-C 05/11/2020, 8:42 AM [facility] [address], [address] [place], [place] [address] [phone]

## 2020-06-08 ENCOUNTER — Ambulatory Visit: Payer: Managed Care, Other (non HMO) | Admitting: Neurology

## 2020-06-23 DIAGNOSIS — R05 Cough: Secondary | ICD-10-CM | POA: Diagnosis not present

## 2020-06-23 DIAGNOSIS — J018 Other acute sinusitis: Secondary | ICD-10-CM | POA: Diagnosis not present

## 2020-06-23 DIAGNOSIS — Z6825 Body mass index (BMI) 25.0-25.9, adult: Secondary | ICD-10-CM | POA: Diagnosis not present

## 2020-06-27 DIAGNOSIS — F411 Generalized anxiety disorder: Secondary | ICD-10-CM | POA: Diagnosis not present

## 2020-06-27 DIAGNOSIS — F4323 Adjustment disorder with mixed anxiety and depressed mood: Secondary | ICD-10-CM | POA: Diagnosis not present

## 2020-07-11 DIAGNOSIS — M791 Myalgia, unspecified site: Secondary | ICD-10-CM | POA: Diagnosis not present

## 2020-07-11 DIAGNOSIS — J3489 Other specified disorders of nose and nasal sinuses: Secondary | ICD-10-CM | POA: Diagnosis not present

## 2020-07-11 DIAGNOSIS — R0602 Shortness of breath: Secondary | ICD-10-CM | POA: Diagnosis not present

## 2020-07-11 DIAGNOSIS — R0989 Other specified symptoms and signs involving the circulatory and respiratory systems: Secondary | ICD-10-CM | POA: Diagnosis not present

## 2020-07-11 DIAGNOSIS — R059 Cough, unspecified: Secondary | ICD-10-CM | POA: Diagnosis not present

## 2020-07-12 DIAGNOSIS — M791 Myalgia, unspecified site: Secondary | ICD-10-CM | POA: Diagnosis not present

## 2020-07-12 DIAGNOSIS — R0602 Shortness of breath: Secondary | ICD-10-CM | POA: Diagnosis not present

## 2020-08-10 NOTE — Progress Notes (Signed)
I have reviewed and agreed above plan. 

## 2020-08-30 DIAGNOSIS — F411 Generalized anxiety disorder: Secondary | ICD-10-CM | POA: Diagnosis not present

## 2020-08-30 DIAGNOSIS — F4323 Adjustment disorder with mixed anxiety and depressed mood: Secondary | ICD-10-CM | POA: Diagnosis not present

## 2020-10-11 DIAGNOSIS — Z23 Encounter for immunization: Secondary | ICD-10-CM | POA: Diagnosis not present

## 2020-11-08 DIAGNOSIS — H9203 Otalgia, bilateral: Secondary | ICD-10-CM | POA: Diagnosis not present

## 2020-11-08 DIAGNOSIS — M791 Myalgia, unspecified site: Secondary | ICD-10-CM | POA: Diagnosis not present

## 2020-11-08 DIAGNOSIS — J029 Acute pharyngitis, unspecified: Secondary | ICD-10-CM | POA: Diagnosis not present

## 2020-12-08 ENCOUNTER — Other Ambulatory Visit: Payer: Self-pay | Admitting: Neurology

## 2021-05-08 ENCOUNTER — Other Ambulatory Visit: Payer: Self-pay

## 2021-05-08 ENCOUNTER — Encounter: Payer: Self-pay | Admitting: Adult Health

## 2021-05-08 ENCOUNTER — Ambulatory Visit (INDEPENDENT_AMBULATORY_CARE_PROVIDER_SITE_OTHER): Payer: BC Managed Care – PPO | Admitting: Adult Health

## 2021-05-08 VITALS — BP 120/78 | HR 81 | Ht 68.0 in | Wt 185.0 lb

## 2021-05-08 DIAGNOSIS — Z5181 Encounter for therapeutic drug level monitoring: Secondary | ICD-10-CM | POA: Diagnosis not present

## 2021-05-08 DIAGNOSIS — R569 Unspecified convulsions: Secondary | ICD-10-CM

## 2021-05-08 NOTE — Progress Notes (Signed)
PATIENT: Patricia Booth DOB: 2001/01/14  REASON FOR VISIT: follow up HISTORY FROM: patient PRIMARY NEUROLOGIST: Dr. Terrace Arabia   HISTORY OF PRESENT ILLNESS: Today 05/08/21:  Patricia Booth is a 20 year old female with a history of seizures. She retuns today for follow-up. She remains on Lamcital ER 100 mg daily. Denies seizures. Continues to work and attend school. No change in mood or behavior. Returns today for follow-up.   HISTORY 05/11/20:   Patricia Booth is a 20 year old female with a history of seizures.  She returns today for follow-up.  She is currently on lamotrigine ER 200 mg daily.  She states that she is not had any seizure events.  She states that if she has a lack of sleep or is around flashing lights she may get a weird sensation as if she is going to have a seizure but then it never comes.  She tries to avoid these triggers.  She is no longer on birth control.  She states that the progesterone only birth control made her feel weird.  She states that she feels better off of birth control.  She returns today for an evaluation.  REVIEW OF SYSTEMS: Out of a complete 14 system review of symptoms, the patient complains only of the following symptoms, and all other reviewed systems are negative.  ALLERGIES: No Known Allergies  HOME MEDICATIONS: Outpatient Medications Prior to Visit  Medication Sig Dispense Refill   ALPRAZolam (XANAX) 0.5 MG tablet Take 0.5 mg by mouth as needed for anxiety.      LamoTRIgine 200 MG TB24 24 hour tablet TAKE 1 TABLET BY MOUTH EVERYDAY AT BEDTIME 90 tablet 1   moxifloxacin (VIGAMOX) 0.5 % SOLN Place 1 drop into both eyes 3 (three) times daily. 1 Bottle 1   sertraline (ZOLOFT) 25 MG tablet Taking 1.5 tablets daily.     No facility-administered medications prior to visit.    PAST MEDICAL HISTORY: Past Medical History:  Diagnosis Date   Anxiety    Visual disturbance     PAST SURGICAL HISTORY: No past surgical history on file.  FAMILY  HISTORY: Family History  Problem Relation Age of Onset   Healthy Mother    Healthy Father     SOCIAL HISTORY: Social History   Socioeconomic History   Marital status: Single    Spouse name: Not on file   Number of children: 0   Years of education: HS student   Highest education level: Not on file  Occupational History   Occupation: Consulting civil engineer  Tobacco Use   Smoking status: Never   Smokeless tobacco: Never  Vaping Use   Vaping Use: Never used  Substance and Sexual Activity   Alcohol use: No   Drug use: No   Sexual activity: Not on file  Other Topics Concern   Not on file  Social History Narrative   Lives at home with parents.   Right-handed.   1 cup caffeine per day.   Social Determinants of Health   Financial Resource Strain: Not on file  Food Insecurity: Not on file  Transportation Needs: Not on file  Physical Activity: Not on file  Stress: Not on file  Social Connections: Not on file  Intimate Partner Violence: Not on file      PHYSICAL EXAM  Vitals:   05/08/21 1301  BP: 120/78  Pulse: 61  Weight: 185 lb (83.9 kg)  Height: 5\' 6"  (1.676 m)   Body mass index is 29.86 kg/m.  Generalized: Well developed,  in no acute distress   Neurological examination  Mentation: Alert oriented to time, place, history taking. Follows all commands speech and language fluent Cranial nerve II-XII: Pupils were equal round reactive to light. Extraocular movements were full, visual field were full on confrontational test.  Head turning and shoulder shrug  were normal and symmetric. Motor: The motor testing reveals 5 over 5 strength of all 4 extremities. Good symmetric motor tone is noted throughout.  Sensory: Sensory testing is intact to soft touch on all 4 extremities. No evidence of extinction is noted.  Coordination: Cerebellar testing reveals good finger-nose-finger and heel-to-shin bilaterally.  Gait and station: Gait is normal.    DIAGNOSTIC DATA (LABS, IMAGING,  TESTING) - I reviewed patient records, labs, notes, testing and imaging myself where available.  Lab Results  Component Value Date   WBC 4.2 08/01/2017   HGB 13.8 08/01/2017   HCT 40.8 08/01/2017   MCV 91 08/01/2017   PLT 276 08/01/2017      Component Value Date/Time   NA 137 08/01/2017 1053   K 4.8 08/01/2017 1053   CL 100 08/01/2017 1053   CO2 24 08/01/2017 1053   GLUCOSE 89 08/01/2017 1053   BUN 10 08/01/2017 1053   CREATININE 0.73 08/01/2017 1053   CALCIUM 9.8 08/01/2017 1053   PROT 7.3 08/01/2017 1053   ALBUMIN 4.7 08/01/2017 1053   AST 19 08/01/2017 1053   ALT 11 08/01/2017 1053   ALKPHOS 55 08/01/2017 1053   BILITOT 0.4 08/01/2017 1053   GFRNONAA 121 08/01/2017 1053   GFRAA 139 08/01/2017 1053    Lab Results  Component Value Date   TSH 1.520 08/01/2017      ASSESSMENT AND PLAN 20 y.o. year old female  has a past medical history of Anxiety and Visual disturbance. here with :  1.  Seizures  Continue Lamictal extended release 200 mg daily Blood work today Advised if symptoms worsen or she develops new symptoms she should let us know Follow-up in 1 year or sooner if needed     Butch Penny, MSN, NP-C 05/08/2021, 1:09 PM [facility] [address] [place], [place] [address] [phone]

## 2021-05-08 NOTE — Patient Instructions (Signed)
Your Plan:  Continue Lamictal ER 200 mg daily Blood work today If your symptoms worsen or you develop new symptoms please let us know.    Thank you for coming to see [place] at [facility]. I hope we have been able to provide you high quality care today.  You may receive a patient satisfaction survey over the next few weeks. We would appreciate your feedback and comments so that we may continue to improve ourselves and the health of our patients.

## 2021-05-09 LAB — CBC WITH DIFFERENTIAL/PLATELET
Basophils Absolute: 0 10*3/uL (ref 0.0–0.2)
Basos: 1 %
EOS (ABSOLUTE): 0.1 10*3/uL (ref 0.0–0.4)
Eos: 1 %
Hematocrit: 40.5 % (ref 34.0–46.6)
Hemoglobin: 13.4 g/dL (ref 11.1–15.9)
Immature Grans (Abs): 0 10*3/uL (ref 0.0–0.1)
Immature Granulocytes: 0 %
Lymphocytes Absolute: 1.4 10*3/uL (ref 0.7–3.1)
Lymphs: 29 %
MCH: 30.5 pg (ref 26.6–33.0)
MCHC: 33.1 g/dL (ref 31.5–35.7)
MCV: 92 fL (ref 79–97)
Monocytes Absolute: 0.4 10*3/uL (ref 0.1–0.9)
Monocytes: 8 %
Neutrophils Absolute: 3.1 10*3/uL (ref 1.4–7.0)
Neutrophils: 61 %
Platelets: 327 10*3/uL (ref 150–450)
RBC: 4.4 x10E6/uL (ref 3.77–5.28)
RDW: 11.5 % — ABNORMAL LOW (ref 11.7–15.4)
WBC: 5 10*3/uL (ref 3.4–10.8)

## 2021-05-09 LAB — COMPREHENSIVE METABOLIC PANEL
ALT: 15 IU/L (ref 0–32)
AST: 17 IU/L (ref 0–40)
Albumin/Globulin Ratio: 1.8 (ref 1.2–2.2)
Albumin: 4.8 g/dL (ref 3.9–5.0)
Alkaline Phosphatase: 83 IU/L (ref 44–121)
BUN/Creatinine Ratio: 13 (ref 9–23)
BUN: 10 mg/dL (ref 6–20)
Bilirubin Total: 0.4 mg/dL (ref 0.0–1.2)
CO2: 24 mmol/L (ref 20–29)
Calcium: 10.1 mg/dL (ref 8.7–10.2)
Chloride: 102 mmol/L (ref 96–106)
Creatinine, Ser: 0.75 mg/dL (ref 0.57–1.00)
Globulin, Total: 2.6 g/dL (ref 1.5–4.5)
Glucose: 86 mg/dL (ref 65–99)
Potassium: 4.5 mmol/L (ref 3.5–5.2)
Sodium: 143 mmol/L (ref 134–144)
Total Protein: 7.4 g/dL (ref 6.0–8.5)
eGFR: 116 mL/min/{1.73_m2} (ref >59)

## 2021-05-09 LAB — LAMOTRIGINE LEVEL: Lamotrigine Lvl: 3.8 ug/mL (ref 2.0–20.0)

## 2021-06-05 ENCOUNTER — Other Ambulatory Visit: Payer: Self-pay | Admitting: Adult Health

## 2021-06-23 DIAGNOSIS — R058 Other specified cough: Secondary | ICD-10-CM | POA: Diagnosis not present

## 2021-06-23 DIAGNOSIS — J0100 Acute maxillary sinusitis, unspecified: Secondary | ICD-10-CM | POA: Diagnosis not present

## 2021-06-23 DIAGNOSIS — Z6828 Body mass index (BMI) 28.0-28.9, adult: Secondary | ICD-10-CM | POA: Diagnosis not present

## 2021-09-21 ENCOUNTER — Encounter (HOSPITAL_BASED_OUTPATIENT_CLINIC_OR_DEPARTMENT_OTHER): Payer: Self-pay | Admitting: Family Medicine

## 2021-09-21 ENCOUNTER — Other Ambulatory Visit: Payer: Self-pay

## 2021-09-21 ENCOUNTER — Ambulatory Visit (HOSPITAL_BASED_OUTPATIENT_CLINIC_OR_DEPARTMENT_OTHER): Payer: BC Managed Care – PPO | Admitting: Family Medicine

## 2021-09-21 VITALS — BP 112/62 | HR 77 | Ht 68.0 in | Wt 179.0 lb

## 2021-09-21 DIAGNOSIS — J111 Influenza due to unidentified influenza virus with other respiratory manifestations: Secondary | ICD-10-CM | POA: Diagnosis not present

## 2021-09-21 DIAGNOSIS — B349 Viral infection, unspecified: Secondary | ICD-10-CM

## 2021-09-21 LAB — POCT INFLUENZA A/B
Influenza A, POC: POSITIVE — AB
Influenza B, POC: NEGATIVE

## 2021-09-21 NOTE — Patient Instructions (Signed)
°  Medication Instructions:  Your physician recommends that you continue on your current medications as directed. Please refer to the Current Medication list given to you today. --If you need a refill on any your medications before your next appointment, please call your pharmacy first. If no refills are authorized on file call the office.-- Follow-Up: Your next appointment:   Your physician recommends that you schedule a follow-up appointment in: 3-4 WEEKS for CPE with Dr. de [place]  You will receive a text message or e-mail with a link to a survey about your care and experience with [place] today! We would greatly appreciate your feedback!   Thanks for letting us be apart of your health journey!!  Primary Care and Sports Medicine   Dr. Ceasar Mons Peru   We encourage you to activate your patient portal called "MyChart".  Sign up information is provided on this After Visit Summary.  MyChart is used to connect with patients for Virtual Visits (Telemedicine).  Patients are able to view lab/test results, encounter notes, upcoming appointments, etc.  Non-urgent messages can be sent to your provider as well. To learn more about what you can do with MyChart, please visit --  [email or web address].

## 2021-09-21 NOTE — Progress Notes (Signed)
New Patient Office Visit  Subjective:  Patient ID: Patricia Booth, female    DOB: Jul 18, 2001  Age: 20 y.o. MRN: [id number]  CC:  Chief Complaint  Patient presents with   URI    Presenting to establish care as well as evaluation of recent illness.  Symptoms began about 3 to 4 days ago.  Has had low-grade fevers on Monday and Tuesday.    HPI Patricia Booth is a 20 yo female presenting to establish in clinic.  She has current concerns as outlined above.  Past medical history significant for seizures, anxiety.  Seizures: Follows with neurology.  Current medications include lamotrigine ER 200 mg daily.  Typically follows up with neurology yearly.  Last visit was in August 2022.  No recent seizures.  Anxiety: Reports longstanding history, has been taking sertraline for about the past 6 years.  Feels that symptoms are well controlled with use of sertraline.  Illness: Began having symptoms about 3 to 4 days ago.  Reports having low-grade fever around time of symptom onset which has improved.  Has also had some sore throat, sinus pressure, chest congestion, bilateral ear pain, cough.  Not necessarily certain of any close contacts which have been sick.  Has been using OTC measures to help with symptom control.  Patient is currently in college, will be returning to school after winter break.  Patient currently is attending [employer].  Majoring in logistics.  Past Medical History:  Diagnosis Date   Anxiety    Visual disturbance     History reviewed. No pertinent surgical history.  Family History  Problem Relation Age of Onset   Healthy Mother    Healthy Father     Social History   Socioeconomic History   Marital status: Single    Spouse name: Not on file   Number of children: 0   Years of education: HS student   Highest education level: Not on file  Occupational History   Occupation: Student  Tobacco Use   Smoking status: Never   Smokeless tobacco: Never  Vaping Use   Vaping Use:  Never used  Substance and Sexual Activity   Alcohol use: Yes    Alcohol/week: 2.0 standard drinks    Types: 2 Cans of beer per week    Comment: on weekends   Drug use: No   Sexual activity: Yes  Other Topics Concern   Not on file  Social History Narrative   Lives at home with parents.   Right-handed.   1 cup caffeine per day.   Social Determinants of Health   Financial Resource Strain: Not on file  Food Insecurity: Not on file  Transportation Needs: Not on file  Physical Activity: Not on file  Stress: Not on file  Social Connections: Not on file  Intimate Partner Violence: Not on file    Objective:   Today's Vitals: BP 112/62    Pulse 77    Ht 5\' 8"  (1.727 m)    Wt 179 lb (81.2 kg)    LMP 09/16/2021    SpO2 98%    BMI 27.22 kg/m   Physical Exam  20 year old female in no acute distress Cardiovascular exam with regular rate and rhythm, no murmurs appreciated Lungs clear to auscultation bilaterally Bilateral external auditory canals are clear with normal-appearing tympanic membranes.  Mild pharyngeal erythema.  Assessment & Plan:   Problem List Items Addressed This Visit       Respiratory   Influenza    Given symptoms,  swab completed for influenza today which returned positive for influenza A Discussed findings with patient as well as potential treatment options.  Given current symptoms, will continue with conservative measures and start Tamiflu which is reasonable Continue with OTC measures to help with symptom control We will plan for follow-up in the next 2 to 3 weeks to complete CPE prior to patient leaving for school      Other Visit Diagnoses     Viral illness    -  Primary   Relevant Orders   POCT Influenza A/B (Completed)       Outpatient Encounter Medications as of 09/21/2021  Medication Sig   LamoTRIgine 200 MG TB24 24 hour tablet TAKE 1 TABLET BY MOUTH EVERYDAY AT BEDTIME   sertraline (ZOLOFT) 25 MG tablet Taking 1.5 tablets daily.    [DISCONTINUED] ALPRAZolam (XANAX) 0.5 MG tablet Take 0.5 mg by mouth as needed for anxiety.    [DISCONTINUED] moxifloxacin (VIGAMOX) 0.5 % SOLN Place 1 drop into both eyes 3 (three) times daily. (Patient not taking: Reported on 09/21/2021)   No facility-administered encounter medications on file as of 09/21/2021.    Follow-up: Return in about 3 weeks (around 10/12/2021).   Quanah Majka J De [place], MD

## 2021-09-26 ENCOUNTER — Ambulatory Visit: Payer: Self-pay | Admitting: Physician Assistant

## 2021-10-12 ENCOUNTER — Other Ambulatory Visit: Payer: Self-pay

## 2021-10-12 ENCOUNTER — Ambulatory Visit (INDEPENDENT_AMBULATORY_CARE_PROVIDER_SITE_OTHER): Payer: BC Managed Care – PPO | Admitting: Family Medicine

## 2021-10-12 ENCOUNTER — Encounter (HOSPITAL_BASED_OUTPATIENT_CLINIC_OR_DEPARTMENT_OTHER): Payer: Self-pay | Admitting: Family Medicine

## 2021-10-12 VITALS — BP 122/70 | HR 51 | Ht 68.0 in | Wt 183.0 lb

## 2021-10-12 DIAGNOSIS — Z113 Encounter for screening for infections with a predominantly sexual mode of transmission: Secondary | ICD-10-CM

## 2021-10-12 DIAGNOSIS — Z0000 Encounter for general adult medical examination without abnormal findings: Secondary | ICD-10-CM | POA: Diagnosis not present

## 2021-10-12 DIAGNOSIS — J111 Influenza due to unidentified influenza virus with other respiratory manifestations: Secondary | ICD-10-CM | POA: Insufficient documentation

## 2021-10-12 NOTE — Assessment & Plan Note (Signed)
The patient was counseled, risk factors were discussed, anticipatory guidance given. Discussed general health promotion recommendations, patient plans to gradually increase level exercise moving forward Is up-to-date on dental screenings and vision exam Up-to-date on tetanus, received HPV vaccine about 4 to 5 years ago Recent labs with neurology reviewed, largely unremarkable, completed CBC and CMP Will check TSH with reflex, lipid panel, screen for HIV and hepatitis C, patient will return on another day with very member in order to have blood drawn Discussed cervical cancer screening recommendations.  Patient does plan to establish with OB/GYN.  Was considering establishing with Dr. Sabra Heck, however she has not taking patients at this time, may need to consider alternative option or check for when new OB/GYN provider starts at [facility].

## 2021-10-12 NOTE — Progress Notes (Signed)
°  Subjective:    CC: Annual Physical Exam  HPI:  Patricia Booth is a 21 y.o. presenting for annual physical  She plans to finish school at [employer] in fall 2023 with plan to start job in 2024.  She does hope to have a job opportunity locally given that most of her family is here.  I reviewed the past medical history, family history, social history, surgical history, and allergies today and no changes were needed.  Please see the problem list section below in epic for further details.  Past Medical History: Past Medical History:  Diagnosis Date   Anxiety    Visual disturbance    Past Surgical History: No past surgical history on file. Social History: Social History   Socioeconomic History   Marital status: Single    Spouse name: Not on file   Number of children: 0   Years of education: HS student   Highest education level: Not on file  Occupational History   Occupation: Ship broker  Tobacco Use   Smoking status: Never   Smokeless tobacco: Never  Vaping Use   Vaping Use: Never used  Substance and Sexual Activity   Alcohol use: Yes    Alcohol/week: 2.0 standard drinks    Types: 2 Cans of beer per week    Comment: on weekends   Drug use: No   Sexual activity: Yes  Other Topics Concern   Not on file  Social History Narrative   Lives at home with parents.   Right-handed.   1 cup caffeine per day.   Social Determinants of Health   Financial Resource Strain: Not on file  Food Insecurity: Not on file  Transportation Needs: Not on file  Physical Activity: Not on file  Stress: Not on file  Social Connections: Not on file   Family History: Family History  Problem Relation Age of Onset   Healthy Mother    Healthy Father    Allergies: No Known Allergies Medications: See med rec.  Review of Systems: No headache, visual changes, nausea, vomiting, diarrhea, constipation, dizziness, abdominal pain, skin rash, fevers, chills, night sweats, swollen lymph nodes, weight loss,  chest pain, body aches, joint swelling, muscle aches, shortness of breath, mood changes, visual or auditory hallucinations.  Objective:    BP 122/70    Pulse (!) 51    Ht 5\' 8"  (1.727 m)    Wt 183 lb (83 kg)    LMP 10/11/2021    SpO2 99%    BMI 27.83 kg/m   General: Well Developed, well nourished, and in no acute distress.  Neuro: Alert and oriented x3, extra-ocular muscles intact, sensation grossly intact. Cranial nerves II through XII are intact, motor, sensory, and coordinative functions are all intact. HEENT: Normocephalic, atraumatic, pupils equal round reactive to light, neck supple, no masses, no lymphadenopathy, thyroid nonpalpable. Oropharynx, nasopharynx, external ear canals are unremarkable. Skin: Warm and dry, no rashes noted.  Cardiac: Regular rate and rhythm, no murmurs rubs or gallops.  Respiratory: Clear to auscultation bilaterally. Not using accessory muscles, speaking in full sentences.  Abdominal: Soft, nontender, nondistended, positive bowel sounds, no masses, no organomegaly.  Musculoskeletal: Shoulder, elbow, wrist, hip, knee, ankle stable, and with full range of motion.  Impression and Recommendations:    No problem-specific Assessment & Plan notes found for this encounter.    ___________________________________________ Nathasha Fiorillo de [place], MD, ABFM, CAQSM [facility] [place]

## 2021-10-12 NOTE — Assessment & Plan Note (Signed)
Given symptoms, swab completed for influenza today which returned positive for influenza A Discussed findings with patient as well as potential treatment options.  Given current symptoms, will continue with conservative measures and start Tamiflu which is reasonable Continue with OTC measures to help with symptom control We will plan for follow-up in the next 2 to 3 weeks to complete CPE prior to patient leaving for school

## 2021-10-12 NOTE — Patient Instructions (Signed)
°  Medication Instructions:  °Your physician recommends that you continue on your current medications as directed. Please refer to the Current Medication list given to you today. °--If you need a refill on any your medications before your next appointment, please call your pharmacy first. If no refills are authorized on file call the office.-- ° °Follow-Up: °Your next appointment:   °Your physician recommends that you schedule a follow-up appointment in: 1 YEAR for CPE with Dr. de [place] ° °You will receive a text message or e-mail with a link to a survey about your care and experience with us today! We would greatly appreciate your feedback!  ° °Thanks for letting us be apart of your health journey!!  °Primary Care and Sports Medicine  ° °Dr. Raymond de [place]  ° °We encourage you to activate your patient portal called "MyChart".  Sign up information is provided on this After Visit Summary.  MyChart is used to connect with patients for Virtual Visits (Telemedicine).  Patients are able to view lab/test results, encounter notes, upcoming appointments, etc.  Non-urgent messages can be sent to your provider as well. To learn more about what you can do with MyChart, please visit --  [email or web address]   ° °

## 2021-12-11 ENCOUNTER — Other Ambulatory Visit: Payer: Self-pay | Admitting: Adult Health

## 2022-03-13 DIAGNOSIS — M79672 Pain in left foot: Secondary | ICD-10-CM | POA: Diagnosis not present

## 2022-03-19 ENCOUNTER — Telehealth (HOSPITAL_BASED_OUTPATIENT_CLINIC_OR_DEPARTMENT_OTHER): Payer: Self-pay

## 2022-03-23 DIAGNOSIS — Z113 Encounter for screening for infections with a predominantly sexual mode of transmission: Secondary | ICD-10-CM | POA: Diagnosis not present

## 2022-03-23 DIAGNOSIS — Z01419 Encounter for gynecological examination (general) (routine) without abnormal findings: Secondary | ICD-10-CM | POA: Diagnosis not present

## 2022-03-23 DIAGNOSIS — Z124 Encounter for screening for malignant neoplasm of cervix: Secondary | ICD-10-CM | POA: Diagnosis not present

## 2022-04-04 DIAGNOSIS — M79672 Pain in left foot: Secondary | ICD-10-CM | POA: Diagnosis not present

## 2022-04-25 DIAGNOSIS — M79672 Pain in left foot: Secondary | ICD-10-CM | POA: Diagnosis not present

## 2022-05-08 ENCOUNTER — Telehealth: Payer: Self-pay | Admitting: Adult Health

## 2022-05-08 NOTE — Telephone Encounter (Signed)
..   Pt understands that although there may be some limitations with this type of visit, we will take all precautions to reduce any security or privacy concerns.  Pt understands that this will be treated like an in office visit and we will file with pt's insurance, and there may be a patient responsible charge related to this service. ? ?

## 2022-05-09 ENCOUNTER — Ambulatory Visit: Payer: BC Managed Care – PPO | Admitting: Adult Health

## 2022-06-15 ENCOUNTER — Other Ambulatory Visit: Payer: Self-pay | Admitting: Adult Health

## 2022-06-27 DIAGNOSIS — H9203 Otalgia, bilateral: Secondary | ICD-10-CM | POA: Diagnosis not present

## 2022-06-27 DIAGNOSIS — J029 Acute pharyngitis, unspecified: Secondary | ICD-10-CM | POA: Diagnosis not present

## 2022-06-27 DIAGNOSIS — J019 Acute sinusitis, unspecified: Secondary | ICD-10-CM | POA: Diagnosis not present

## 2022-06-27 DIAGNOSIS — Z6827 Body mass index (BMI) 27.0-27.9, adult: Secondary | ICD-10-CM | POA: Diagnosis not present

## 2022-07-20 NOTE — Telephone Encounter (Signed)
Patient wanted to wait on refill.

## 2022-10-15 ENCOUNTER — Encounter (HOSPITAL_BASED_OUTPATIENT_CLINIC_OR_DEPARTMENT_OTHER): Payer: BC Managed Care – PPO | Admitting: Family Medicine

## 2022-10-23 ENCOUNTER — Encounter (HOSPITAL_BASED_OUTPATIENT_CLINIC_OR_DEPARTMENT_OTHER): Payer: BC Managed Care – PPO | Admitting: Family Medicine

## 2022-11-05 ENCOUNTER — Telehealth (INDEPENDENT_AMBULATORY_CARE_PROVIDER_SITE_OTHER): Payer: BC Managed Care – PPO | Admitting: Adult Health

## 2022-11-05 DIAGNOSIS — R569 Unspecified convulsions: Secondary | ICD-10-CM

## 2022-11-05 NOTE — Progress Notes (Signed)
PATIENT: CORBY VILLASENOR DOB: [birth year]  REASON FOR VISIT: follow up HISTORY FROM: patient  Virtual Visit via Video Note  I connected with Jorene Minors on 11/05/22 at  2:30 PM EST by a video enabled telemedicine application located remotely at [facility] and verified that I am speaking with the correct person using two identifiers who was located at their own home.   I discussed the limitations of evaluation and management by telemedicine and the availability of in person appointments. The patient expressed understanding and agreed to proceed.   PATIENT: Jorene Minors DOB: [birth year]  REASON FOR VISIT: follow up HISTORY FROM: patient  HISTORY OF PRESENT ILLNESS: Today 11/05/22:  KATHE WIRICK is a 22 y.o. female with a history of seizures. Returns today for follow-up.  Reports that she has not had any seizure events.  Remains on Lamictal extended release 200 mg daily.  Currently in college.  Will graduate this year.     HISTORY  05/08/21:   Ms. Donofrio is a 22 year old female with a history of seizures. She retuns today for follow-up. She remains on Lamcital ER 100 mg daily. Denies seizures. Continues to work and attend school. No change in mood or behavior. Returns today for follow-up.   REVIEW OF SYSTEMS: Out of a complete 14 system review of symptoms, the patient complains only of the following symptoms, and all other reviewed systems are negative.  ALLERGIES: No Known Allergies  HOME MEDICATIONS: Outpatient Medications Prior to Visit  Medication Sig Dispense Refill   LamoTRIgine 200 MG TB24 24 hour tablet TAKE ONE TABLET BY MOUTH EVERY NIGHT AT BEDTIME 90 tablet 1   sertraline (ZOLOFT) 25 MG tablet Taking 1.5 tablets daily.     No facility-administered medications prior to visit.    PAST MEDICAL HISTORY: Past Medical History:  Diagnosis Date   Anxiety    Visual disturbance     PAST SURGICAL HISTORY: No past surgical history on  file.  FAMILY HISTORY: Family History  Problem Relation Age of Onset   Healthy Mother    Healthy Father     SOCIAL HISTORY: Social History   Socioeconomic History   Marital status: Single    Spouse name: Not on file   Number of children: 0   Years of education: HS student   Highest education level: Not on file  Occupational History   Occupation: Ship broker  Tobacco Use   Smoking status: Never   Smokeless tobacco: Never  Vaping Use   Vaping Use: Never used  Substance and Sexual Activity   Alcohol use: Yes    Alcohol/week: 2.0 standard drinks of alcohol    Types: 2 Cans of beer per week    Comment: on weekends   Drug use: No   Sexual activity: Yes  Other Topics Concern   Not on file  Social History Narrative   Lives at home with parents.   Right-handed.   1 cup caffeine per day.   Social Determinants of Health   Financial Resource Strain: Not on file  Food Insecurity: Not on file  Transportation Needs: Not on file  Physical Activity: Not on file  Stress: Not on file  Social Connections: Not on file  Intimate Partner Violence: Not on file      PHYSICAL EXAM Generalized: Well developed, in no acute distress   Neurological examination  Mentation: Alert oriented to time, place, history taking. Follows all commands speech and language fluent Cranial nerve II-XII:Extraocular  movements were full. Facial symmetry noted. uvula tongue midline. Head turning and shoulder shrug  were normal and symmetric. Motor: Good strength throughout subjectively per patient Sensory: Sensory testing is intact to soft touch on all 4 extremities subjectively per patient Coordination: Cerebellar testing reveals good finger-nose-finger  Gait and station: Patient is able to stand from a seated position. gait is normal.  Reflexes: UTA  DIAGNOSTIC DATA (LABS, IMAGING, TESTING) - I reviewed patient records, labs, notes, testing and imaging myself where available.  Lab Results  Component  Value Date   WBC 5.0 05/08/2021   HGB 13.4 05/08/2021   HCT 40.5 05/08/2021   MCV 92 05/08/2021   PLT 327 05/08/2021      Component Value Date/Time   NA 143 05/08/2021 1327   K 4.5 05/08/2021 1327   CL 102 05/08/2021 1327   CO2 24 05/08/2021 1327   GLUCOSE 86 05/08/2021 1327   BUN 10 05/08/2021 1327   CREATININE 0.75 05/08/2021 1327   CALCIUM 10.1 05/08/2021 1327   PROT 7.4 05/08/2021 1327   ALBUMIN 4.8 05/08/2021 1327   AST 17 05/08/2021 1327   ALT 15 05/08/2021 1327   ALKPHOS 83 05/08/2021 1327   BILITOT 0.4 05/08/2021 1327   GFRNONAA 121 08/01/2017 1053   GFRAA 139 08/01/2017 1053   Lab Results  Component Value Date   TSH 1.520 08/01/2017      ASSESSMENT AND PLAN 23 y.o. year old female  has a past medical history of Anxiety and Visual disturbance. here with:  1.  Seizures  Continue Lamictal extended release 10 mg daily Patient will have blood work with her PCP next month-she will ask for elemental level to be checked Follow-up in 1 year or sooner if needed      Ward Givens, MSN, NP-C 11/05/2022, 3:16 PM [facility] [address], [place], [place] [address] [phone]

## 2022-12-03 ENCOUNTER — Other Ambulatory Visit (HOSPITAL_BASED_OUTPATIENT_CLINIC_OR_DEPARTMENT_OTHER): Payer: Self-pay

## 2022-12-03 ENCOUNTER — Telehealth (HOSPITAL_BASED_OUTPATIENT_CLINIC_OR_DEPARTMENT_OTHER): Payer: Self-pay

## 2022-12-03 ENCOUNTER — Other Ambulatory Visit: Payer: Self-pay | Admitting: Family Medicine

## 2022-12-03 DIAGNOSIS — F419 Anxiety disorder, unspecified: Secondary | ICD-10-CM | POA: Insufficient documentation

## 2022-12-03 MED ORDER — SERTRALINE HCL 50 MG PO TABS: 50.0000 mg | ORAL_TABLET | Freq: Every day | ORAL | 1 refills | Status: DC

## 2022-12-03 NOTE — Progress Notes (Signed)
Refill sent. Has appointment with PCP scheduled for follow-up.

## 2022-12-03 NOTE — Telephone Encounter (Signed)
Pt called and requested a refill on zoloft. Dr. [place] stated in notes he would refill for pt but at the time pt didn't need the refill she only has 3 pills left now. She also is away in college right now.   Thanks

## 2022-12-10 ENCOUNTER — Ambulatory Visit (INDEPENDENT_AMBULATORY_CARE_PROVIDER_SITE_OTHER): Payer: BC Managed Care – PPO | Admitting: Family Medicine

## 2022-12-10 ENCOUNTER — Encounter (HOSPITAL_BASED_OUTPATIENT_CLINIC_OR_DEPARTMENT_OTHER): Payer: Self-pay | Admitting: Family Medicine

## 2022-12-10 VITALS — BP 130/74 | HR 90 | Temp 97.6°F | Ht 68.0 in | Wt 184.5 lb

## 2022-12-10 DIAGNOSIS — F419 Anxiety disorder, unspecified: Secondary | ICD-10-CM

## 2022-12-10 DIAGNOSIS — R569 Unspecified convulsions: Secondary | ICD-10-CM | POA: Diagnosis not present

## 2022-12-10 DIAGNOSIS — Z0000 Encounter for general adult medical examination without abnormal findings: Secondary | ICD-10-CM | POA: Diagnosis not present

## 2022-12-10 MED ORDER — SERTRALINE HCL 50 MG PO TABS: 50.0000 mg | ORAL_TABLET | Freq: Every day | ORAL | 1 refills | Status: DC

## 2022-12-10 NOTE — Assessment & Plan Note (Signed)
Routine HCM labs ordered. HCM reviewed/discussed. Anticipatory guidance regarding healthy weight, lifestyle and choices given. Recommend healthy diet.  Recommend approximately 150 minutes/week of moderate intensity exercise Recommend regular dental and vision exams Always use seatbelt/lap and shoulder restraints Recommend using smoke alarms and checking batteries at least twice a year Recommend using sunscreen when outside Discussed tetanus immunization recommendations, patient is UTD

## 2022-12-10 NOTE — Progress Notes (Signed)
Subjective:    CC: Annual Physical Exam  HPI:  Patricia Booth is a 22 y.o. presenting for annual physical  I reviewed the past medical history, family history, social history, surgical history, and allergies today and no changes were needed.  Please see the problem list section below in epic for further details.  Past Medical History: Past Medical History:  Diagnosis Date   Anxiety    Visual disturbance    Past Surgical History: No past surgical history on file. Social History: Social History   Socioeconomic History   Marital status: Single    Spouse name: Not on file   Number of children: 0   Years of education: HS student   Highest education level: Not on file  Occupational History   Occupation: Ship broker  Tobacco Use   Smoking status: Never   Smokeless tobacco: Never  Vaping Use   Vaping Use: Never used  Substance and Sexual Activity   Alcohol use: Yes    Alcohol/week: 2.0 standard drinks of alcohol    Types: 2 Cans of beer per week    Comment: on weekends   Drug use: No   Sexual activity: Yes  Other Topics Concern   Not on file  Social History Narrative   Lives at home with parents.   Right-handed.   1 cup caffeine per day.   Social Determinants of Health   Financial Resource Strain: Not on file  Food Insecurity: Not on file  Transportation Needs: Not on file  Physical Activity: Not on file  Stress: Not on file  Social Connections: Not on file   Family History: Family History  Problem Relation Age of Onset   Healthy Mother    Healthy Father    Allergies: No Known Allergies Medications: See med rec.  Review of Systems: No headache, visual changes, nausea, vomiting, diarrhea, constipation, dizziness, abdominal pain, skin rash, fevers, chills, night sweats, swollen lymph nodes, weight loss, chest pain, body aches, joint swelling, muscle aches, shortness of breath, mood changes, visual or auditory hallucinations.  Objective:    BP 130/74 (BP  Location: Left Arm, Patient Position: Sitting, Cuff Size: Large)   Pulse 90   Temp 97.6 F (36.4 C) (Oral)   Ht '5\' 8"'$  (1.727 m)   Wt 184 lb 8 oz (83.7 kg)   SpO2 100%   BMI 28.05 kg/m   General: Well Developed, well nourished, and in no acute distress. Neuro: Alert and oriented x3, extra-ocular muscles intact, sensation grossly intact. Cranial nerves II through XII are intact, motor, sensory, and coordinative functions are all intact. HEENT: Normocephalic, atraumatic, pupils equal round reactive to light, neck supple, no masses, no lymphadenopathy, thyroid nonpalpable. Oropharynx, nasopharynx, external ear canals are unremarkable. Skin: Warm and dry, no rashes noted. Cardiac: Regular rate and rhythm, no murmurs rubs or gallops. Respiratory: Clear to auscultation bilaterally. Not using accessory muscles, speaking in full sentences. Abdominal: Soft, nontender, nondistended, positive bowel sounds, no masses, no organomegaly. Musculoskeletal: Shoulder, elbow, wrist, hip, knee, ankle stable, and with full range of motion.  Impression and Recommendations:    Wellness examination Routine HCM labs ordered. HCM reviewed/discussed. Anticipatory guidance regarding healthy weight, lifestyle and choices given. Recommend healthy diet.  Recommend approximately 150 minutes/week of moderate intensity exercise Recommend regular dental and vision exams Always use seatbelt/lap and shoulder restraints Recommend using smoke alarms and checking batteries at least twice a year Recommend using sunscreen when outside Discussed tetanus immunization recommendations, patient is UTD  Will check lamotrigine level today  and CC results to her neurologist. Patient also has had some intermittent symptoms of chest tightness which she wonders if that is related to underlying anxiety or other potential cardiac or pulmonary issue.  Exam today was overall reassuring.  She does not have any risk factors related to  cardiovascular or pulmonary disease.  Does have some family history of asthma.  Symptoms tend to occur at rest, may be present for a few days.  She does have increased stress.  Denies any exertional symptoms.  We discussed that symptoms likely are related to her underlying diagnosis of anxiety.  We discussed potential options such as changes in pharmacotherapy including adjusting dose of sertraline or addition of alternative medication such as BuSpar for augmentation.  Additionally, could consider counseling.  For now, patient will monitor symptoms and will let us know if symptoms persist and she would like to proceed with adjustment in therapy.  Return in about 1 year (around 12/10/2023) for CPE.   ___________________________________________ Ferlin Fairhurst de [place], MD, ABFM, [facility] [place]

## 2022-12-12 LAB — LIPID PANEL
Chol/HDL Ratio: 2.9 ratio (ref 0.0–4.4)
Cholesterol, Total: 174 mg/dL (ref 100–199)
HDL: 60 mg/dL (ref >39)
LDL Chol Calc (NIH): 97 mg/dL (ref 0–99)
Triglycerides: 94 mg/dL (ref 0–149)
VLDL Cholesterol Cal: 17 mg/dL (ref 5–40)

## 2022-12-12 LAB — COMPREHENSIVE METABOLIC PANEL
ALT: 13 IU/L (ref 0–32)
AST: 15 IU/L (ref 0–40)
Albumin/Globulin Ratio: 2 (ref 1.2–2.2)
Albumin: 4.7 g/dL (ref 4.0–5.0)
Alkaline Phosphatase: 72 IU/L (ref 44–121)
BUN/Creatinine Ratio: 14 (ref 9–23)
BUN: 11 mg/dL (ref 6–20)
Bilirubin Total: 0.2 mg/dL (ref 0.0–1.2)
CO2: 19 mmol/L — ABNORMAL LOW (ref 20–29)
Calcium: 9.4 mg/dL (ref 8.7–10.2)
Chloride: 106 mmol/L (ref 96–106)
Creatinine, Ser: 0.8 mg/dL (ref 0.57–1.00)
Globulin, Total: 2.3 g/dL (ref 1.5–4.5)
Glucose: 101 mg/dL — ABNORMAL HIGH (ref 70–99)
Potassium: 4.1 mmol/L (ref 3.5–5.2)
Sodium: 142 mmol/L (ref 134–144)
Total Protein: 7 g/dL (ref 6.0–8.5)
eGFR: 106 mL/min/{1.73_m2} (ref >59)

## 2022-12-12 LAB — LAMOTRIGINE LEVEL: Lamotrigine Lvl: 4 ug/mL (ref 2.0–20.0)

## 2022-12-12 LAB — CBC WITH DIFFERENTIAL/PLATELET
Basophils Absolute: 0 10*3/uL (ref 0.0–0.2)
Basos: 1 %
EOS (ABSOLUTE): 0.1 10*3/uL (ref 0.0–0.4)
Eos: 1 %
Hematocrit: 41.5 % (ref 34.0–46.6)
Hemoglobin: 13.9 g/dL (ref 11.1–15.9)
Immature Grans (Abs): 0 10*3/uL (ref 0.0–0.1)
Immature Granulocytes: 0 %
Lymphocytes Absolute: 1.1 10*3/uL (ref 0.7–3.1)
Lymphs: 19 %
MCH: 31.4 pg (ref 26.6–33.0)
MCHC: 33.5 g/dL (ref 31.5–35.7)
MCV: 94 fL (ref 79–97)
Monocytes Absolute: 0.4 10*3/uL (ref 0.1–0.9)
Monocytes: 7 %
Neutrophils Absolute: 4.2 10*3/uL (ref 1.4–7.0)
Neutrophils: 72 %
Platelets: 310 10*3/uL (ref 150–450)
RBC: 4.42 x10E6/uL (ref 3.77–5.28)
RDW: 11.2 % — ABNORMAL LOW (ref 11.7–15.4)
WBC: 5.8 10*3/uL (ref 3.4–10.8)

## 2022-12-12 LAB — TSH RFX ON ABNORMAL TO FREE T4: TSH: 1.71 u[IU]/mL (ref 0.450–4.500)

## 2022-12-14 ENCOUNTER — Other Ambulatory Visit: Payer: Self-pay | Admitting: Adult Health

## 2022-12-20 ENCOUNTER — Telehealth: Payer: Self-pay | Admitting: Adult Health

## 2022-12-20 ENCOUNTER — Other Ambulatory Visit: Payer: Self-pay | Admitting: *Deleted

## 2022-12-20 MED ORDER — LAMOTRIGINE ER 200 MG PO TB24: 1.0000 | ORAL_TABLET | Freq: Every day | ORAL | 1 refills | Status: DC

## 2022-12-20 NOTE — Telephone Encounter (Signed)
Lamotrigine ER 200 mg Rx sent to [facility] [phone]

## 2022-12-20 NOTE — Telephone Encounter (Signed)
Pt is back at school now and is asking if it is possible to have her LamoTRIgine 200 MG TB24 24 hour tablet  sent to [facility] #[id number]

## 2023-01-10 ENCOUNTER — Other Ambulatory Visit (HOSPITAL_BASED_OUTPATIENT_CLINIC_OR_DEPARTMENT_OTHER): Payer: Self-pay | Admitting: Family Medicine

## 2023-01-10 DIAGNOSIS — F419 Anxiety disorder, unspecified: Secondary | ICD-10-CM

## 2023-03-28 DIAGNOSIS — Z6828 Body mass index (BMI) 28.0-28.9, adult: Secondary | ICD-10-CM | POA: Diagnosis not present

## 2023-03-28 DIAGNOSIS — Z01419 Encounter for gynecological examination (general) (routine) without abnormal findings: Secondary | ICD-10-CM | POA: Diagnosis not present

## 2023-08-14 DIAGNOSIS — L089 Local infection of the skin and subcutaneous tissue, unspecified: Secondary | ICD-10-CM | POA: Diagnosis not present

## 2023-10-23 ENCOUNTER — Telehealth: Payer: Self-pay | Admitting: Adult Health

## 2023-10-23 NOTE — Telephone Encounter (Signed)
Pt canceled appt. Will call back to r/s

## 2023-10-25 ENCOUNTER — Telehealth: Payer: BC Managed Care – PPO | Admitting: Adult Health

## 2023-10-30 ENCOUNTER — Encounter (HOSPITAL_BASED_OUTPATIENT_CLINIC_OR_DEPARTMENT_OTHER): Payer: Self-pay

## 2023-12-11 ENCOUNTER — Encounter (HOSPITAL_BASED_OUTPATIENT_CLINIC_OR_DEPARTMENT_OTHER): Payer: BC Managed Care – PPO | Admitting: Family Medicine

## 2023-12-14 ENCOUNTER — Other Ambulatory Visit: Payer: Self-pay | Admitting: Adult Health

## 2023-12-16 ENCOUNTER — Other Ambulatory Visit: Payer: Self-pay | Admitting: *Deleted

## 2023-12-16 NOTE — Telephone Encounter (Signed)
 Received refill request for Lamotrigine er 200 mg (from [facility]). Patient overdue for appt. She canceled her last one and hasn't rescheduled yet. I gave her a temporary refill with message stating appt needed for further refills.

## 2023-12-28 ENCOUNTER — Other Ambulatory Visit (HOSPITAL_BASED_OUTPATIENT_CLINIC_OR_DEPARTMENT_OTHER): Payer: Self-pay | Admitting: Family Medicine

## 2023-12-28 DIAGNOSIS — F419 Anxiety disorder, unspecified: Secondary | ICD-10-CM

## 2024-01-15 ENCOUNTER — Other Ambulatory Visit: Payer: Self-pay | Admitting: Adult Health

## 2024-02-12 ENCOUNTER — Other Ambulatory Visit: Payer: Self-pay | Admitting: Adult Health

## 2024-02-13 ENCOUNTER — Telehealth: Payer: Self-pay | Admitting: Adult Health

## 2024-02-13 NOTE — Telephone Encounter (Signed)
 LVM and sent mychart msg asking pt to call back to schedule follow up for medication refills.   If patient calls back please schedule her for first available appointment with either Megan or Dr. Gracie Lav. Can be video or in office per Odie Benne

## 2024-03-01 ENCOUNTER — Other Ambulatory Visit: Payer: Self-pay | Admitting: Adult Health

## 2024-03-03 ENCOUNTER — Telehealth: Payer: Self-pay | Admitting: *Deleted

## 2024-03-03 MED ORDER — LAMOTRIGINE ER 200 MG PO TB24: 1.0000 | ORAL_TABLET | Freq: Every day | ORAL | 0 refills | Status: DC

## 2024-03-03 NOTE — Telephone Encounter (Signed)
 Received refill request for Lamotrigine  for patient today . 15 day refill was sent already and note prompting patient appointment needed for refill . No appointment . LVM for patient to call back to schedule a f/u appointment for refill request  . Did send  a 7 day script .

## 2024-03-04 NOTE — Telephone Encounter (Signed)
 Pt has called and scheduled her 1 yr f/u

## 2024-03-05 NOTE — Telephone Encounter (Signed)
 Follow up scheduled on 05/26/24

## 2024-03-29 ENCOUNTER — Other Ambulatory Visit (HOSPITAL_BASED_OUTPATIENT_CLINIC_OR_DEPARTMENT_OTHER): Payer: Self-pay | Admitting: Family Medicine

## 2024-03-29 DIAGNOSIS — F419 Anxiety disorder, unspecified: Secondary | ICD-10-CM

## 2024-05-02 ENCOUNTER — Emergency Department (HOSPITAL_COMMUNITY)

## 2024-05-02 ENCOUNTER — Encounter (HOSPITAL_COMMUNITY): Payer: Self-pay

## 2024-05-02 ENCOUNTER — Emergency Department (HOSPITAL_COMMUNITY)
Admission: EM | Admit: 2024-05-02 | Discharge: 2024-05-02 | Disposition: A | Discharge status: Home / Self Care | Attending: Emergency Medicine | Admitting: Emergency Medicine

## 2024-05-02 ENCOUNTER — Other Ambulatory Visit: Payer: Self-pay

## 2024-05-02 DIAGNOSIS — S52135A Nondisplaced fracture of neck of left radius, initial encounter for closed fracture: Secondary | ICD-10-CM | POA: Insufficient documentation

## 2024-05-02 DIAGNOSIS — M25522 Pain in left elbow: Secondary | ICD-10-CM | POA: Diagnosis present

## 2024-05-02 DIAGNOSIS — W010XXA Fall on same level from slipping, tripping and stumbling without subsequent striking against object, initial encounter: Secondary | ICD-10-CM | POA: Diagnosis not present

## 2024-05-02 DIAGNOSIS — W19XXXA Unspecified fall, initial encounter: Secondary | ICD-10-CM

## 2024-05-02 MED ORDER — FENTANYL CITRATE PF 50 MCG/ML IJ SOSY
50.0000 ug | PREFILLED_SYRINGE | Freq: Once | INTRAMUSCULAR | Status: AC
  Administered 2024-05-02: 50 ug via INTRAVENOUS
  Filled 2024-05-02 (×2): qty 1

## 2024-05-02 NOTE — Progress Notes (Signed)
 Orthopedic Tech Progress Note Patient Details:  LEYNA VANDERKOLK [birth year] [id number]  Ortho Devices Type of Ortho Device: Arm sling, Post (long arm) splint Ortho Device/Splint Location: lue Ortho Device/Splint Interventions: Ordered, Application, Adjustment   Post Interventions Patient Tolerated: Well Instructions Provided: Care of device, Adjustment of device  Chandra Dorn PARAS 05/02/2024, 10:44 PM
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
## 2024-05-02 NOTE — ED Notes (Signed)
Contacted ortho for splint placement

## 2024-05-02 NOTE — Discharge Instructions (Signed)
 Elevate your arm and take the 800 mg ibuprofen and 2 Tylenol together every 6 hours for pain.  Do not lift anything with that arm

## 2024-05-02 NOTE — ED Provider Notes (Signed)
 [place] EMERGENCY DEPARTMENT AT [facility] Provider Note   CSN: [id number] Arrival date & time: 05/02/24  1858     Patient presents with: Arm Injury   Patricia Booth is a 23 y.o. female.   Patient is a 22 year old female with a history of anxiety who is presenting today with injury to her left elbow after a fall.  [place] after they had pulled it up on the trailer when her foot slipped on the step over the wheel and she fell backwards onto the concrete catching herself with her left arm.  She was having significant pain in her left elbow and initially was not even able to move her fingers.  She denies injuring anything else during this fall.  She reports now she has sensation in her fingers and she has no pain in her hand or wrist but is still having a lot of pain in her elbow.  No shoulder pain.  The history is provided by the patient.  Arm Injury      Prior to Admission medications   Medication Sig Start Date End Date Taking? Authorizing Provider  LamoTRIgine  200 MG TB24 24 hour tablet TAKE 1 TABLET BY MOUTH EVERY NIGHT AT BEDTIME 03/05/24   Sherryl Bouchard, NP  LamoTRIgine  200 MG TB24 24 hour tablet Take 1 tablet (200 mg total) by mouth at bedtime. Must be seen for further refills. Call [phone]. 03/03/24   Millikan, Megan, NP  sertraline  (ZOLOFT ) 50 MG tablet TAKE 1 TABLET BY MOUTH DAILY 03/30/24   de [place], Quintin PARAS, MD    Allergies: Patient has no known allergies.    Review of Systems  Updated Vital Signs BP 125/70   Pulse 91   Temp 98.5 F (36.9 C) (Oral)   Resp 18   SpO2 100%   Physical Exam Vitals and nursing note reviewed.  HENT:     Head: Normocephalic.  Cardiovascular:     Rate and Rhythm: Normal rate.     Pulses: Normal pulses.     Comments: 2+ left radial pulse Pulmonary:     Effort: Pulmonary effort is normal.  Musculoskeletal:        General: Tenderness present.     Left shoulder:  Normal.     Left elbow: Decreased range of motion. Tenderness present in radial head, medial epicondyle, lateral epicondyle and olecranon process.     Left forearm: Normal.     Left wrist: Normal.     Comments: Holds her elbow in mild flexion and unable to fully extend or flex  Skin:    General: Skin is warm.  Neurological:     Mental Status: She is alert.     (all labs ordered are listed, but only abnormal results are displayed) Labs Reviewed - No data to display  EKG: None  Radiology: DG Elbow Complete Left Result Date: 05/02/2024 CLINICAL DATA:  Pain after fall EXAM: LEFT ELBOW - COMPLETE 3+ VIEW COMPARISON:  None Available. FINDINGS: Large joint effusion is present. There is an acute transverse mildly comminuted fracture through the head/neck junction of the radial head. On the lateral view there is a small triangular shaped osseous fragment adjacent CT radial head donor site uncertain. This may represent coronoid process fracture of the ulna is a. Joint spaces are maintained. IMPRESSION: 1. Acute transverse fracture through the head/neck junction of the radial head. 2. Large joint effusion. 3. Small triangular shaped osseous fragment  adjacent to the radial head donor site uncertain. This may represent coronoid process fracture of the ulna. Electronically Signed   By: Greig Pique M.D.   On: 05/02/2024 20:46     Procedures   Medications Ordered in the ED  fentaNYL  (SUBLIMAZE ) injection 50 mcg (has no administration in time range)                                    Medical Decision Making Amount and/or Complexity of Data Reviewed Radiology: ordered and independent interpretation performed. Decision-making details documented in ED Course.  Risk Prescription drug management.   Patient presenting today after a fall and injury to the left elbow.  Concern for fracture versus dislocation versus contusion and sprain.  Neurovascularly intact at this time.  No findings to suggest  wrist or hand injury.  Low suspicion for shoulder injury.  I have independently visualized and interpreted pt's images today.  Elbow image shows radial head fracture.  No findings of dislocation.  Discussed with Dr. Ahmad who recommends CT of the elbow.  Patient placed in a long-arm splint and sling and to follow-up with Ortho on Thursday or Friday of next week.  All this was discussed with the patient and her family.  They are comfortable with this plan.      Final diagnoses:  Fall, initial encounter  Closed nondisplaced fracture of neck of left radius, initial encounter    ED Discharge Orders     None          Doretha Folks, MD 05/02/24 2137

## 2024-05-07 ENCOUNTER — Other Ambulatory Visit: Payer: Self-pay | Admitting: Adult Health

## 2024-05-26 ENCOUNTER — Ambulatory Visit (INDEPENDENT_AMBULATORY_CARE_PROVIDER_SITE_OTHER): Admitting: Family Medicine

## 2024-05-26 ENCOUNTER — Telehealth (INDEPENDENT_AMBULATORY_CARE_PROVIDER_SITE_OTHER): Admitting: Adult Health

## 2024-05-26 ENCOUNTER — Encounter (HOSPITAL_BASED_OUTPATIENT_CLINIC_OR_DEPARTMENT_OTHER): Payer: Self-pay | Admitting: Family Medicine

## 2024-05-26 VITALS — BP 128/81 | HR 87 | Ht 68.0 in | Wt 200.2 lb

## 2024-05-26 DIAGNOSIS — Z0000 Encounter for general adult medical examination without abnormal findings: Secondary | ICD-10-CM

## 2024-05-26 DIAGNOSIS — Z118 Encounter for screening for other infectious and parasitic diseases: Secondary | ICD-10-CM | POA: Diagnosis not present

## 2024-05-26 DIAGNOSIS — R569 Unspecified convulsions: Secondary | ICD-10-CM | POA: Diagnosis not present

## 2024-05-26 DIAGNOSIS — Z5181 Encounter for therapeutic drug level monitoring: Secondary | ICD-10-CM | POA: Diagnosis not present

## 2024-05-26 MED ORDER — LAMOTRIGINE ER 200 MG PO TB24: 1.0000 | ORAL_TABLET | Freq: Every day | ORAL | 3 refills | Status: AC

## 2024-05-26 NOTE — Assessment & Plan Note (Signed)

## 2024-05-26 NOTE — Progress Notes (Signed)
 PATIENT: Patricia Booth DOB: 06/14/2001  REASON FOR VISIT: follow up HISTORY FROM: patient  Virtual Visit via Video Note  I connected with Patricia Booth on 05/26/24 at  8:30 AM EDT by a video enabled telemedicine application located remotely at [facility] and verified that I am speaking with the correct person using two identifiers who was located at their own home in    I discussed the limitations of evaluation and management by telemedicine and the availability of in person appointments. The patient expressed understanding and agreed to proceed.   PATIENT: Patricia Booth DOB: Jan 26, 2001  REASON FOR VISIT: follow up HISTORY FROM: patient  HISTORY OF PRESENT ILLNESS: Today 05/26/24:  Patricia Booth is a 23 y.o. female with a history of seizures. Returns today for follow-up.  Overall she reports that she has been doing well.  She has not had any additional seizure events.  She reports that her last seizure was when she was 23 years old.  She was in [place] traveling and had not slept for 36 hours.  She remains on Lamictal  extended release 200 mg daily.  Tolerating the medication well.  Denies any changes in her mood or behavior.  No changes with her gait or balance.  She does state that she fell and broke her left arm.  She states that she was on a boat trailer and stepped off the trailer wrong causing her to fall.  She returns today for an evaluation.   HISTORY:  11/05/22: Patricia Booth is a 23 y.o. female with a history of seizures. Returns today for follow-up.  Reports that she has not had any seizure events.  Remains on Lamictal  extended release 200 mg daily.  Currently in college.  Will graduate this year.    05/08/21:   Patricia Booth is a 23 year old female with a history of seizures. She retuns today for follow-up. She remains on Lamcital ER 100 mg daily. Denies seizures. Continues to work and attend school. No change in mood or behavior. Returns today for  follow-up.   REVIEW OF SYSTEMS: Out of a complete 14 system review of symptoms, the patient complains only of the following symptoms, and all other reviewed systems are negative.  ALLERGIES: No Known Allergies  HOME MEDICATIONS: Outpatient Medications Prior to Visit  Medication Sig Dispense Refill   LamoTRIgine  200 MG TB24 24 hour tablet Take 1 tablet (200 mg total) by mouth at bedtime. Must be seen for further refills. Call [phone]. 7 tablet 0   LamoTRIgine  200 MG TB24 24 hour tablet TAKE 1 TABLET BY MOUTH EVERY NIGHT AT BEDTIME 30 tablet 0   sertraline  (ZOLOFT ) 50 MG tablet TAKE 1 TABLET BY MOUTH DAILY 90 tablet 0   No facility-administered medications prior to visit.    PAST MEDICAL HISTORY: Past Medical History:  Diagnosis Date   Anxiety    Visual disturbance     PAST SURGICAL HISTORY: No past surgical history on file.  FAMILY HISTORY: Family History  Problem Relation Age of Onset   Healthy Mother    Healthy Father     SOCIAL HISTORY: Social History   Socioeconomic History   Marital status: Single    Spouse name: Not on file   Number of children: 0   Years of education: HS student   Highest education level: Not on file  Occupational History   Occupation: Student  Tobacco Use   Smoking status: Never   Smokeless tobacco: Never  Vaping Use   Vaping status: Never Used  Substance and Sexual Activity   Alcohol use: Yes    Alcohol/week: 2.0 standard drinks of alcohol    Types: 2 Cans of beer per week    Comment: on weekends   Drug use: No   Sexual activity: Yes  Other Topics Concern   Not on file  Social History Narrative   Lives at home with parents.   Right-handed.   1 cup caffeine per day.   Social Drivers of Corporate investment banker Strain: Not on file  Food Insecurity: Not on file  Transportation Needs: Not on file  Physical Activity: Not on file  Stress: Not on file  Social Connections: Not on file  Intimate Partner Violence: Not on  file      PHYSICAL EXAM Generalized: Well developed, in no acute distress   Neurological examination  Mentation: Alert oriented to time, place, history taking. Follows all commands speech and language fluent Cranial nerve II-XII: Facial symmetry noted  DIAGNOSTIC DATA (LABS, IMAGING, TESTING) - I reviewed patient records, labs, notes, testing and imaging myself where available.  Lab Results  Component Value Date   WBC 5.8 12/10/2022   HGB 13.9 12/10/2022   HCT 41.5 12/10/2022   MCV 94 12/10/2022   PLT 310 12/10/2022      Component Value Date/Time   NA 142 12/10/2022 1425   K 4.1 12/10/2022 1425   CL 106 12/10/2022 1425   CO2 19 (L) 12/10/2022 1425   GLUCOSE 101 (H) 12/10/2022 1425   BUN 11 12/10/2022 1425   CREATININE 0.80 12/10/2022 1425   CALCIUM 9.4 12/10/2022 1425   PROT 7.0 12/10/2022 1425   ALBUMIN 4.7 12/10/2022 1425   AST 15 12/10/2022 1425   ALT 13 12/10/2022 1425   ALKPHOS 72 12/10/2022 1425   BILITOT 0.2 12/10/2022 1425   GFRNONAA 121 08/01/2017 1053   GFRAA 139 08/01/2017 1053   Lab Results  Component Value Date   TSH 1.710 12/10/2022      ASSESSMENT AND PLAN 23 y.o. year old female  has a past medical history of Anxiety and Visual disturbance. here with:  1.  Seizures  Continue Lamictal  extended release 200 mg daily Will be seeing her PCP today.  She will have blood work there.  She will ask if a Lamictal  level can be checked as well. I did advise that since she has been stable if her PCP is amenable to managing Lamictal  she can follow-up with our office on an as needed basis.  She verbalized that she would discuss this with her PCP. Follow-up in 1 year or sooner if needed.  Unless PCP takes over management  Meds ordered this encounter  Medications   LamoTRIgine  200 MG TB24 24 hour tablet    Sig: Take 1 tablet (200 mg total) by mouth at bedtime.    Dispense:  90 tablet    Refill:  3     Duwaine Russell, MSN, NP-C 05/26/2024, 8:37  AM [facility] [address] [place], [place] [address] [phone]

## 2024-05-26 NOTE — Assessment & Plan Note (Signed)
 Doing well, no recent seizures, has been following with neurology. No concerns. Due for recheck of lamotrigine  level, will check with labs Wonders about us  assuming management of lamotrigine  given stability with medication. Would be fine for us  to do this.

## 2024-05-26 NOTE — Patient Instructions (Signed)
  Medication Instructions:  Your physician recommends that you continue on your current medications as directed. Please refer to the Current Medication list given to you today. --If you need a refill on any your medications before your next appointment, please call your pharmacy first. If no refills are authorized on file call the office.-- Lab Work: Your physician has recommended that you have lab work today: labs when fasting can be afternoon around 4pm If you have labs (blood work) drawn today and your tests are completely normal, you will receive your results via MyChart message OR a phone call from our staff.  Please ensure you check your voicemail in the event that you authorized detailed messages to be left on a delegated number. If you have any lab test that is abnormal or we need to change your treatment, we will call you to review the results.  Follow-Up: Your next appointment:   Your physician recommends that you schedule a follow-up appointment in: 1 year physical  with Dr. de [place]  You will receive a text message or e-mail with a link to a survey about your care and experience with us  today! We would greatly appreciate your feedback!   Thanks for letting us  be apart of your health journey!!  Primary Care and Sports Medicine   Dr. Quintin sheerer [place]   We encourage you to activate your patient portal called MyChart.  Sign up information is provided on this After Visit Summary.  MyChart is used to connect with patients for Virtual Visits (Telemedicine).  Patients are able to view lab/test results, encounter notes, upcoming appointments, etc.  Non-urgent messages can be sent to your provider as well. To learn more about what you can do with MyChart, please visit --  [email or web address].

## 2024-05-26 NOTE — Patient Instructions (Signed)
 Your Plan:  Continue Lamictal  ER 200 mg daily If your symptoms worsen or you develop new symptoms please let us  know.    Thank you for coming to see us  at [facility]. I hope we have been able to provide you high quality care today.  You may receive a patient satisfaction survey over the next few weeks. We would appreciate your feedback and comments so that we may continue to improve ourselves and the health of our patients.

## 2024-05-26 NOTE — Progress Notes (Signed)
 Subjective:    CC: Annual Physical Exam  HPI: Patricia Booth is a 23 y.o. presenting for annual physical  I reviewed the past medical history, family history, social history, surgical history, and allergies today and no changes were needed.  Please see the problem list section below in epic for further details.  Past Medical History: Past Medical History:  Diagnosis Date   Anxiety    Visual disturbance    Past Surgical History: History reviewed. No pertinent surgical history. Social History: Social History   Socioeconomic History   Marital status: Single    Spouse name: Not on file   Number of children: 0   Years of education: HS student   Highest education level: Not on file  Occupational History   Occupation: Student  Tobacco Use   Smoking status: Never    Passive exposure: Never   Smokeless tobacco: Never  Vaping Use   Vaping status: Never Used  Substance and Sexual Activity   Alcohol use: Yes    Alcohol/week: 2.0 standard drinks of alcohol    Types: 2 Cans of beer per week    Comment: on weekends   Drug use: No   Sexual activity: Yes  Other Topics Concern   Not on file  Social History Narrative   Lives at home with parents.   Right-handed.   1 cup caffeine per day.   Social Drivers of Corporate investment banker Strain: Not on file  Food Insecurity: Not on file  Transportation Needs: Not on file  Physical Activity: Not on file  Stress: Not on file  Social Connections: Not on file   Family History: Family History  Problem Relation Age of Onset   Healthy Mother    Healthy Father    Allergies: No Known Allergies Medications: See med rec.  Review of Systems: No headache, visual changes, nausea, vomiting, diarrhea, constipation, dizziness, abdominal pain, skin rash, fevers, chills, night sweats, swollen lymph nodes, weight loss, chest pain, body aches, joint swelling, muscle aches, shortness of breath, mood changes, visual or auditory  hallucinations.  Objective:    BP 128/81 (BP Location: Right Arm, Patient Position: Sitting, Cuff Size: Normal)   Pulse 87   Ht 5' 8 (1.727 m)   Wt 200 lb 3.2 oz (90.8 kg)   SpO2 97%   BMI 30.44 kg/m   General: Well Developed, well nourished, and in no acute distress. Neuro: Alert and oriented x3, extra-ocular muscles intact, sensation grossly intact. Cranial nerves II through XII are intact, motor, sensory, and coordinative functions are all intact. HEENT: Normocephalic, atraumatic, pupils equal round reactive to light, neck supple, no masses, no lymphadenopathy, thyroid  nonpalpable. Oropharynx, nasopharynx, external ear canals are unremarkable. Skin: Warm and dry, no rashes noted. Cardiac: Regular rate and rhythm, no murmurs rubs or gallops. Respiratory: Clear to auscultation bilaterally. Not using accessory muscles, speaking in full sentences. Abdominal: Soft, nontender, nondistended, positive bowel sounds, no masses, no organomegaly.  Musculoskeletal: Shoulder, elbow, wrist, hip, knee, ankle stable, and with full range of motion.  Impression and Recommendations:    Screening for chlamydial disease -     Chlamydia/GC NAA, Confirmation  Wellness examination Assessment & Plan: Routine HCM labs ordered. HCM reviewed/discussed. Anticipatory guidance regarding healthy weight, lifestyle and choices given. Recommend healthy diet.  Recommend approximately 150 minutes/week of moderate intensity exercise Recommend regular dental and vision exams Always use seatbelt/lap and shoulder restraints Recommend using smoke alarms and checking batteries at least twice a year Recommend using sunscreen when  outside Discussed immunization recommendations  Orders: -     CBC with Differential/Platelet; Future -     Comprehensive metabolic panel with GFR; Future -     Hemoglobin A1c; Future -     Lipid panel; Future -     TSH Rfx on Abnormal to Free T4; Future  Encounter for medication  monitoring -     Lamotrigine  level; Future  Seizures (HCC) Assessment & Plan: Doing well, no recent seizures, has been following with neurology. No concerns. Due for recheck of lamotrigine  level, will check with labs Wonders about us  assuming management of lamotrigine  given stability with medication. Would be fine for us  to do this.   Return in about 1 year (around 05/26/2025) for CPE.   ___________________________________________ Ronith Berti de [place], MD, ABFM, [facility]

## 2024-05-28 LAB — CHLAMYDIA/GC NAA, CONFIRMATION
Chlamydia trachomatis, NAA: NEGATIVE
Neisseria gonorrhoeae, NAA: NEGATIVE

## 2024-06-01 ENCOUNTER — Ambulatory Visit (HOSPITAL_BASED_OUTPATIENT_CLINIC_OR_DEPARTMENT_OTHER): Payer: Self-pay | Admitting: Family Medicine

## 2024-06-24 ENCOUNTER — Other Ambulatory Visit (HOSPITAL_BASED_OUTPATIENT_CLINIC_OR_DEPARTMENT_OTHER): Payer: Self-pay | Admitting: Family Medicine

## 2024-06-24 DIAGNOSIS — F419 Anxiety disorder, unspecified: Secondary | ICD-10-CM

## 2024-09-20 ENCOUNTER — Other Ambulatory Visit (HOSPITAL_BASED_OUTPATIENT_CLINIC_OR_DEPARTMENT_OTHER): Payer: Self-pay | Admitting: Family Medicine

## 2024-09-20 DIAGNOSIS — F419 Anxiety disorder, unspecified: Secondary | ICD-10-CM

## 2025-05-27 ENCOUNTER — Telehealth: Admitting: Adult Health
